# Patient Record
Sex: Female | Born: 1990
Health system: Southern US, Community
[De-identification: ages and names within clinical notes are randomized; demographics above are authoritative.]

## PROBLEM LIST (undated history)

## (undated) ENCOUNTER — Inpatient Hospital Stay: Payer: Self-pay

## (undated) ENCOUNTER — Emergency Department: Admission: EM | Discharge status: Absent without leave | Payer: PRIVATE HEALTH INSURANCE

## (undated) DIAGNOSIS — F32A Depression, unspecified: Secondary | ICD-10-CM

## (undated) DIAGNOSIS — S129XXA Fracture of neck, unspecified, initial encounter: Secondary | ICD-10-CM

## (undated) DIAGNOSIS — F329 Major depressive disorder, single episode, unspecified: Secondary | ICD-10-CM

## (undated) DIAGNOSIS — Z789 Other specified health status: Secondary | ICD-10-CM

## (undated) HISTORY — DX: Fracture of neck, unspecified, initial encounter: S12.9XXA

## (undated) HISTORY — PX: WISDOM TOOTH EXTRACTION: SHX21

## (undated) HISTORY — PX: TONSILLECTOMY: SUR1361

## (undated) HISTORY — PX: RHINOPLASTY: SUR1284

## (undated) HISTORY — PX: ANKLE FRACTURE SURGERY: SHX122

---
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
## 2015-02-17 ENCOUNTER — Ambulatory Visit (INDEPENDENT_AMBULATORY_CARE_PROVIDER_SITE_OTHER): Payer: BLUE CROSS/BLUE SHIELD | Admitting: Obstetrics and Gynecology

## 2015-02-17 ENCOUNTER — Encounter: Payer: Self-pay | Admitting: Obstetrics and Gynecology

## 2015-02-17 VITALS — BP 120/84 | HR 94 | Ht 65.0 in | Wt 156.5 lb

## 2015-02-17 DIAGNOSIS — Z3491 Encounter for supervision of normal pregnancy, unspecified, first trimester: Secondary | ICD-10-CM

## 2015-02-17 DIAGNOSIS — N926 Irregular menstruation, unspecified: Secondary | ICD-10-CM

## 2015-02-17 LAB — POCT URINE PREGNANCY: Preg Test, Ur: POSITIVE — AB

## 2015-02-17 NOTE — Patient Instructions (Signed)
How a Baby Grows During Pregnancy Pregnancy begins when a female's sperm enters a female's egg (fertilization). This happens in one of the tubes (fallopian tubes) that connect the ovaries to the womb (uterus). The fertilized egg is called an embryo until it reaches 10 weeks. From 10 weeks until birth, it is called a fetus. The fertilized egg moves down the fallopian tube to the uterus. Then it implants into the lining of the uterus and begins to grow. The developing fetus receives oxygen and nutrients through the pregnant woman's bloodstream and the tissues that grow (placenta) to support the fetus. The placenta is the life support system for the fetus. It provides nutrition and removes waste. Learning as much as you can about your pregnancy and how your baby is developing can help you enjoy the experience. It can also make you aware of when there might be a problem and when to ask questions. HOW LONG DOES A TYPICAL PREGNANCY LAST? A pregnancy usually lasts 280 days, or about 40 weeks. Pregnancy is divided into three trimesters:  First trimester: 0-13 weeks.  Second trimester: 14-27 weeks.  Third trimester: 28-40 weeks. The day when your baby is considered ready to be born (full term) is your estimated date of delivery. HOW DOES MY BABY DEVELOP MONTH BY MONTH? First month  The fertilized egg attaches to the inside of the uterus.  Some cells will form the placenta. Others will form the fetus.  The arms, legs, brain, spinal cord, lungs, and heart begin to develop.  At the end of the first month, the heart begins to beat. Second month  The bones, inner ear, eyelids, hands, and feet form.  The genitals develop.  By the end of 8 weeks, all major organs are developing. Third month  All of the internal organs are forming.  Teeth develop below the gums.  Bones and muscles begin to grow. The spine can flex.  The skin is transparent.  Fingernails and toenails begin to form.  Arms and  legs continue to grow longer, and hands and feet develop.  The fetus is about 3 in (7.6 cm) long. Fourth month  The placenta is completely formed.  The external sex organs, neck, outer ear, eyebrows, eyelids, and fingernails are formed.  The fetus can hear, swallow, and move its arms and legs.  The kidneys begin to produce urine.  The skin is covered with a white waxy coating (vernix) and very fine hair (lanugo). Fifth month  The fetus moves around more and can be felt for the first time (quickening).  The fetus starts to sleep and wake up and may begin to suck its finger.  The nails grow to the end of the fingers.  The organ in the digestive system that makes bile (gallbladder) functions and helps to digest the nutrients.  If your baby is a girl, eggs are present in her ovaries. If your baby is a boy, testicles start to move down into his scrotum. Sixth month  The lungs are formed, but the fetus is not yet able to breathe.  The eyes open. The brain continues to develop.  Your baby has fingerprints and toe prints. Your baby's hair grows thicker.  At the end of the second trimester, the fetus is about 9 in (22.9 cm) long. Seventh month  The fetus kicks and stretches.  The eyes are developed enough to sense changes in light.  The hands can make a grasping motion.  The fetus responds to sound. Eighth month  All organs and body systems are fully developed and functioning.  Bones harden and taste buds develop. The fetus may hiccup.  Certain areas of the brain are still developing. The skull remains soft. Ninth month  The fetus gains about  lb (0.23 kg) each week.  The lungs are fully developed.  Patterns of sleep develop.  The fetus's head typically moves into a head-down position (vertex) in the uterus to prepare for birth. If the buttocks move into a vertex position instead, the baby is breech.  The fetus weighs 6-9 lbs (2.72-4.08 kg) and is 19-20 in  (48.26-50.8 cm) long. WHAT CAN I DO TO HAVE A HEALTHY PREGNANCY AND HELP MY BABY DEVELOP? Eating and Drinking  Eat a healthy diet.  Talk with your health care provider to make sure that you are getting the nutrients that you and your baby need.  Visit www.BuildDNA.es to learn about creating a healthy diet.  Gain a healthy amount of weight during pregnancy as advised by your health care provider. This is usually 25-35 pounds. You may need to:  Gain more if you were underweight before getting pregnant or if you are pregnant with more than one baby.  Gain less if you were overweight or obese when you got pregnant. Medicines and Vitamins  Take prenatal vitamins as directed by your health care provider. These include vitamins such as folic acid, iron, calcium, and vitamin D. They are important for healthy development.  Take medicines only as directed by your health care provider. Read labels and ask a pharmacist or your health care provider whether over-the-counter medicines, supplements, and prescription drugs are safe to take during pregnancy. Activities  Be physically active as advised by your health care provider. Ask your health care provider to recommend activities that are safe for you to do, such as walking or swimming.  Do not participate in strenuous or extreme sports. Lifestyle  Do not drink alcohol.  Do not use any tobacco products, including cigarettes, chewing tobacco, or electronic cigarettes. If you need help quitting, ask your health care provider.  Do not use illegal drugs. Safety  Avoid exposure to mercury, lead, or other heavy metals. Ask your health care provider about common sources of these heavy metals.  Avoid listeria infection during pregnancy. Follow these precautions:  Do not eat soft cheeses or deli meats.  Do not eat hot dogs unless they have been warmed up to the point of steaming, such as in the microwave oven.  Do not drink unpasteurized  milk.  Avoid toxoplasmosis infection during pregnancy. Follow these precautions:  Do not change your cat's litter box, if you have a cat. Ask someone else to do this for you.  Wear gardening gloves while working in the yard. General Instructions  Keep all follow-up visits as directed by your health care provider. This is important. This includes prenatal care and screening tests.  Manage any chronic health conditions. Work closely with your health care provider to keep conditions, such as diabetes, under control. HOW DO I KNOW IF MY BABY IS DEVELOPING WELL? At each prenatal visit, your health care provider will do several different tests to check on your health and keep track of your baby's development. These include:  Fundal height.  Your health care provider will measure your growing belly from top to bottom using a tape measure.  Your health care provider will also feel your belly to determine your baby's position.  Heartbeat.  An ultrasound in the first trimester can confirm  pregnancy and show a heartbeat, depending on how far along you are.  Your health care provider will check your baby's heart rate at every prenatal visit.  As you get closer to your delivery date, you may have regular fetal heart rate monitoring to make sure that your baby is not in distress.  Second trimester ultrasound.  This ultrasound checks your baby's development. It also indicates your baby's gender. WHAT SHOULD I DO IF I HAVE CONCERNS ABOUT MY BABY'S DEVELOPMENT? Always talk with your health care provider about any concerns that you may have.   This information is not intended to replace advice given to you by your health care provider. Make sure you discuss any questions you have with your health care provider.   Document Released: 09/27/2007 Document Revised: 12/30/2014 Document Reviewed: 09/17/2013 Elsevier Interactive Patient Education 2016 [employer]. First Trimester of Pregnancy The  first trimester of pregnancy is from week 1 until the end of week 12 (months 1 through 3). A week after a sperm fertilizes an egg, the egg will implant on the wall of the uterus. This embryo will begin to develop into a baby. Genes from you and your partner are forming the baby. The female genes determine whether the baby is a boy or a girl. At 6-8 weeks, the eyes and face are formed, and the heartbeat can be seen on ultrasound. At the end of 12 weeks, all the baby's organs are formed.  Now that you are pregnant, you will want to do everything you can to have a healthy baby. Two of the most important things are to get good prenatal care and to follow your health care provider's instructions. Prenatal care is all the medical care you receive before the baby's birth. This care will help prevent, find, and treat any problems during the pregnancy and childbirth. BODY CHANGES Your body goes through many changes during pregnancy. The changes vary from woman to woman.   You may gain or lose a couple of pounds at first.  You may feel sick to your stomach (nauseous) and throw up (vomit). If the vomiting is uncontrollable, call your health care provider.  You may tire easily.  You may develop headaches that can be relieved by medicines approved by your health care provider.  You may urinate more often. Painful urination may mean you have a bladder infection.  You may develop heartburn as a result of your pregnancy.  You may develop constipation because certain hormones are causing the muscles that push waste through your intestines to slow down.  You may develop hemorrhoids or swollen, bulging veins (varicose veins).  Your breasts may begin to grow larger and become tender. Your nipples may stick out more, and the tissue that surrounds them (areola) may become darker.  Your gums may bleed and may be sensitive to brushing and flossing.  Dark spots or blotches (chloasma, mask of pregnancy) may develop on  your face. This will likely fade after the baby is born.  Your menstrual periods will stop.  You may have a loss of appetite.  You may develop cravings for certain kinds of food.  You may have changes in your emotions from day to day, such as being excited to be pregnant or being concerned that something may go wrong with the pregnancy and baby.  You may have more vivid and strange dreams.  You may have changes in your hair. These can include thickening of your hair, rapid growth, and changes in texture. Some  women also have hair loss during or after pregnancy, or hair that feels dry or thin. Your hair will most likely return to normal after your baby is born. WHAT TO EXPECT AT YOUR PRENATAL VISITS During a routine prenatal visit:  You will be weighed to make sure you and the baby are growing normally.  Your blood pressure will be taken.  Your abdomen will be measured to track your baby's growth.  The fetal heartbeat will be listened to starting around week 10 or 12 of your pregnancy.  Test results from any previous visits will be discussed. Your health care provider may ask you:  How you are feeling.  If you are feeling the baby move.  If you have had any abnormal symptoms, such as leaking fluid, bleeding, severe headaches, or abdominal cramping.  If you are using any tobacco products, including cigarettes, chewing tobacco, and electronic cigarettes.  If you have any questions. Other tests that may be performed during your first trimester include:  Blood tests to find your blood type and to check for the presence of any previous infections. They will also be used to check for low iron levels (anemia) and Rh antibodies. Later in the pregnancy, blood tests for diabetes will be done along with other tests if problems develop.  Urine tests to check for infections, diabetes, or protein in the urine.  An ultrasound to confirm the proper growth and development of the baby.  An  amniocentesis to check for possible genetic problems.  Fetal screens for spina bifida and Down syndrome.  You may need other tests to make sure you and the baby are doing well.  HIV (human immunodeficiency virus) testing. Routine prenatal testing includes screening for HIV, unless you choose not to have this test. HOME CARE INSTRUCTIONS  Medicines  Follow your health care provider's instructions regarding medicine use. Specific medicines may be either safe or unsafe to take during pregnancy.  Take your prenatal vitamins as directed.  If you develop constipation, try taking a stool softener if your health care provider approves. Diet  Eat regular, well-balanced meals. Choose a variety of foods, such as meat or vegetable-based protein, fish, milk and low-fat dairy products, vegetables, fruits, and whole grain breads and cereals. Your health care provider will help you determine the amount of weight gain that is right for you.  Avoid raw meat and uncooked cheese. These carry germs that can cause birth defects in the baby.  Eating four or five small meals rather than three large meals a day may help relieve nausea and vomiting. If you start to feel nauseous, eating a few soda crackers can be helpful. Drinking liquids between meals instead of during meals also seems to help nausea and vomiting.  If you develop constipation, eat more high-fiber foods, such as fresh vegetables or fruit and whole grains. Drink enough fluids to keep your urine clear or pale yellow. Activity and Exercise  Exercise only as directed by your health care provider. Exercising will help you:  Control your weight.  Stay in shape.  Be prepared for labor and delivery.  Experiencing pain or cramping in the lower abdomen or low back is a good sign that you should stop exercising. Check with your health care provider before continuing normal exercises.  Try to avoid standing for long periods of time. Move your legs  often if you must stand in one place for a long time.  Avoid heavy lifting.  Wear low-heeled shoes, and practice good posture.  You may continue to have sex unless your health care provider directs you otherwise. Relief of Pain or Discomfort  Wear a good support bra for breast tenderness.   Take warm sitz baths to soothe any pain or discomfort caused by hemorrhoids. Use hemorrhoid cream if your health care provider approves.   Rest with your legs elevated if you have leg cramps or low back pain.  If you develop varicose veins in your legs, wear support hose. Elevate your feet for 15 minutes, 3-4 times a day. Limit salt in your diet. Prenatal Care  Schedule your prenatal visits by the twelfth week of pregnancy. They are usually scheduled monthly at first, then more often in the last 2 months before delivery.  Write down your questions. Take them to your prenatal visits.  Keep all your prenatal visits as directed by your health care provider. Safety  Wear your seat belt at all times when driving.  Make a list of emergency phone numbers, including numbers for family, friends, the hospital, and police and fire departments. General Tips  Ask your health care provider for a referral to a local prenatal education class. Begin classes no later than at the beginning of month 6 of your pregnancy.  Ask for help if you have counseling or nutritional needs during pregnancy. Your health care provider can offer advice or refer you to specialists for help with various needs.  Do not use hot tubs, steam rooms, or saunas.  Do not douche or use tampons or scented sanitary pads.  Do not cross your legs for long periods of time.  Avoid cat litter boxes and soil used by cats. These carry germs that can cause birth defects in the baby and possibly loss of the fetus by miscarriage or stillbirth.  Avoid all smoking, herbs, alcohol, and medicines not prescribed by your health care provider.  Chemicals in these affect the formation and growth of the baby.  Do not use any tobacco products, including cigarettes, chewing tobacco, and electronic cigarettes. If you need help quitting, ask your health care provider. You may receive counseling support and other resources to help you quit.  Schedule a dentist appointment. At home, brush your teeth with a soft toothbrush and be gentle when you floss. SEEK MEDICAL CARE IF:   You have dizziness.  You have mild pelvic cramps, pelvic pressure, or nagging pain in the abdominal area.  You have persistent nausea, vomiting, or diarrhea.  You have a bad smelling vaginal discharge.  You have pain with urination.  You notice increased swelling in your face, hands, legs, or ankles. SEEK IMMEDIATE MEDICAL CARE IF:   You have a fever.  You are leaking fluid from your vagina.  You have spotting or bleeding from your vagina.  You have severe abdominal cramping or pain.  You have rapid weight gain or loss.  You vomit blood or material that looks like coffee grounds.  You are exposed to [place] measles and have never had them.  You are exposed to fifth disease or chickenpox.  You develop a severe headache.  You have shortness of breath.  You have any kind of trauma, such as from a fall or a car accident.   This information is not intended to replace advice given to you by your health care provider. Make sure you discuss any questions you have with your health care provider.   Document Released: 04/04/2001 Document Revised: 05/01/2014 Document Reviewed: 02/18/2013 Elsevier Interactive Patient Education [employer] Elsevier Inc.

## 2015-02-17 NOTE — Progress Notes (Signed)
Subjective:     Patient ID: Suzanne HurlNicole Duerst, female   DOB: Feb 16, 1991, 24 y.o.   MRN: [id number]  HPI LMP 01/11/15 with EGA 251w3d and EDC 10/19/15  Review of Systems Slight nausea Light brown spotting x 2 days last week    Objective:   Physical Exam A&O x4 Well groomed female in no distress Pelvic exam not indicated UPT +    Assessment:     First trimester pregnancy      Plan:     Viability scan in 2 weeks with NOB Labs and nurse intake NOB PE in 5 weeks Yolanda BonineMelody Burr, CNM

## 2015-03-03 ENCOUNTER — Ambulatory Visit: Payer: BLUE CROSS/BLUE SHIELD

## 2015-03-03 ENCOUNTER — Ambulatory Visit (INDEPENDENT_AMBULATORY_CARE_PROVIDER_SITE_OTHER): Payer: BLUE CROSS/BLUE SHIELD | Admitting: Obstetrics and Gynecology

## 2015-03-03 VITALS — BP 118/68 | HR 78 | Wt 158.0 lb

## 2015-03-03 DIAGNOSIS — R638 Other symptoms and signs concerning food and fluid intake: Secondary | ICD-10-CM

## 2015-03-03 DIAGNOSIS — Z331 Pregnant state, incidental: Secondary | ICD-10-CM

## 2015-03-03 DIAGNOSIS — Z3490 Encounter for supervision of normal pregnancy, unspecified, unspecified trimester: Secondary | ICD-10-CM

## 2015-03-03 DIAGNOSIS — N926 Irregular menstruation, unspecified: Secondary | ICD-10-CM | POA: Diagnosis not present

## 2015-03-03 DIAGNOSIS — Z113 Encounter for screening for infections with a predominantly sexual mode of transmission: Secondary | ICD-10-CM

## 2015-03-03 MED ORDER — CONCEPT DHA 53.5-38-1 MG PO CAPS: 1.0000 | ORAL_CAPSULE | Freq: Every day | ORAL | Status: DC

## 2015-03-03 NOTE — Progress Notes (Signed)
Suzanne HurlNicole Brame presents for NOB nurse interview visit. G-2.  P-0. Pregnancy eduction material explained and given. No cats in the home. NOB labs ordered. TSH/HbgA1c due to Increased BMI, HIV labs and Drug screen were explained optional and she could opt out of tests but did not decline. Drug screen ordered and sent to lab. PNV encouraged. Declined genetic testing. Pt. To follow up with provider in 4 vweeks for NOB physical.  All questions answered.  ZIKA EXPOSURE SCREEN:  The patient has not traveled to a [place] endemic area within the past 6 months, nor has she had unprotected sex with a partner who has travelled to a [place] endemic region within the past 6 months. The patient has been advised to notify us if these factors change any time during this current pregnancy, so adequate testing and monitoring can be initiated.

## 2015-03-03 NOTE — Patient Instructions (Signed)

## 2015-03-04 LAB — MICROSCOPIC EXAMINATION: Casts: NONE SEEN /lpf

## 2015-03-04 LAB — PAIN MGT SCRN (14 DRUGS), UR
Amphetamine Screen, Ur: NEGATIVE ng/mL
BENZODIAZEPINE SCREEN, URINE: NEGATIVE ng/mL
Barbiturate Screen, Ur: NEGATIVE ng/mL
Buprenorphine, Urine: NEGATIVE ng/mL
CANNABINOIDS UR QL SCN: NEGATIVE ng/mL
CREATININE(CRT), U: 34.7 mg/dL (ref 20.0–300.0)
Cocaine(Metab.)Screen, Urine: NEGATIVE ng/mL
FENTANYL, URINE: NEGATIVE pg/mL
MEPERIDINE SCREEN, URINE: NEGATIVE ng/mL
Methadone Scn, Ur: NEGATIVE ng/mL
OPIATE SCRN UR: NEGATIVE ng/mL
OXYCODONE+OXYMORPHONE UR QL SCN: NEGATIVE ng/mL
PCP SCRN UR: NEGATIVE ng/mL
PROPOXYPHENE SCREEN: NEGATIVE ng/mL
Ph of Urine: 6.5 (ref 4.5–8.9)
Tramadol Ur Ql Scn: NEGATIVE ng/mL

## 2015-03-04 LAB — GC/CHLAMYDIA PROBE AMP
CHLAMYDIA, DNA PROBE: NEGATIVE
NEISSERIA GONORRHOEAE BY PCR: NEGATIVE

## 2015-03-04 LAB — CBC WITH DIFFERENTIAL/PLATELET
BASOS ABS: 0 10*3/uL (ref 0.0–0.2)
Basos: 0 %
EOS (ABSOLUTE): 0.1 10*3/uL (ref 0.0–0.4)
Eos: 1 %
HEMATOCRIT: 38.6 % (ref 34.0–46.6)
Hemoglobin: 13.2 g/dL (ref 11.1–15.9)
Immature Grans (Abs): 0 10*3/uL (ref 0.0–0.1)
Immature Granulocytes: 0 %
LYMPHS ABS: 2.4 10*3/uL (ref 0.7–3.1)
Lymphs: 24 %
MCH: 30.8 pg (ref 26.6–33.0)
MCHC: 34.2 g/dL (ref 31.5–35.7)
MCV: 90 fL (ref 79–97)
MONOS ABS: 0.8 10*3/uL (ref 0.1–0.9)
Monocytes: 8 %
NEUTROS ABS: 6.7 10*3/uL (ref 1.4–7.0)
Neutrophils: 67 %
Platelets: 365 10*3/uL (ref 150–379)
RBC: 4.28 x10E6/uL (ref 3.77–5.28)
RDW: 13.2 % (ref 12.3–15.4)
WBC: 10 10*3/uL (ref 3.4–10.8)

## 2015-03-04 LAB — URINALYSIS, ROUTINE W REFLEX MICROSCOPIC
BILIRUBIN UA: NEGATIVE
Glucose, UA: NEGATIVE
Ketones, UA: NEGATIVE
NITRITE UA: NEGATIVE
PH UA: 7 (ref 5.0–7.5)
PROTEIN UA: NEGATIVE
RBC UA: NEGATIVE
Specific Gravity, UA: 1.008 (ref 1.005–1.030)
UUROB: 0.2 mg/dL (ref 0.2–1.0)

## 2015-03-04 LAB — TSH: TSH: 2.39 u[IU]/mL (ref 0.450–4.500)

## 2015-03-04 LAB — HIV ANTIBODY (ROUTINE TESTING W REFLEX): HIV Screen 4th Generation wRfx: NONREACTIVE

## 2015-03-04 LAB — NICOTINE SCREEN, URINE: Cotinine Ql Scrn, Ur: NEGATIVE ng/mL

## 2015-03-04 LAB — RH TYPE: Rh Factor: POSITIVE

## 2015-03-04 LAB — RUBELLA ANTIBODY, IGM: Rubella IgM: <20 AU/mL (ref 0.0–19.9)

## 2015-03-04 LAB — ABO

## 2015-03-04 LAB — RPR: RPR: NONREACTIVE

## 2015-03-04 LAB — ANTIBODY SCREEN: ANTIBODY SCREEN: NEGATIVE

## 2015-03-04 LAB — HEPATITIS B SURFACE ANTIGEN: Hepatitis B Surface Ag: NEGATIVE

## 2015-03-04 LAB — VARICELLA ZOSTER ANTIBODY, IGM

## 2015-03-05 LAB — URINE CULTURE, OB REFLEX

## 2015-03-05 LAB — HEMOGLOBIN A1C: HEMOGLOBIN A1C: 5.2 % (ref 4.8–5.6)

## 2015-03-05 LAB — CULTURE, OB URINE

## 2015-03-09 ENCOUNTER — Encounter: Payer: Self-pay | Admitting: Obstetrics and Gynecology

## 2015-03-25 ENCOUNTER — Ambulatory Visit (INDEPENDENT_AMBULATORY_CARE_PROVIDER_SITE_OTHER): Payer: BLUE CROSS/BLUE SHIELD | Admitting: Obstetrics and Gynecology

## 2015-03-25 ENCOUNTER — Encounter: Payer: Self-pay | Admitting: Obstetrics and Gynecology

## 2015-03-25 ENCOUNTER — Other Ambulatory Visit: Payer: Self-pay | Admitting: Obstetrics and Gynecology

## 2015-03-25 VITALS — BP 110/64 | HR 74 | Wt 157.1 lb

## 2015-03-25 DIAGNOSIS — Z283 Underimmunization status: Secondary | ICD-10-CM

## 2015-03-25 DIAGNOSIS — O09899 Supervision of other high risk pregnancies, unspecified trimester: Secondary | ICD-10-CM

## 2015-03-25 DIAGNOSIS — Z2839 Other underimmunization status: Secondary | ICD-10-CM | POA: Insufficient documentation

## 2015-03-25 DIAGNOSIS — O9989 Other specified diseases and conditions complicating pregnancy, childbirth and the puerperium: Secondary | ICD-10-CM

## 2015-03-25 DIAGNOSIS — Z789 Other specified health status: Secondary | ICD-10-CM

## 2015-03-25 DIAGNOSIS — Z3491 Encounter for supervision of normal pregnancy, unspecified, first trimester: Secondary | ICD-10-CM

## 2015-03-25 LAB — POCT URINALYSIS DIPSTICK
BILIRUBIN UA: NEGATIVE
Blood, UA: NEGATIVE
GLUCOSE UA: NEGATIVE
Ketones, UA: NEGATIVE
LEUKOCYTES UA: NEGATIVE
NITRITE UA: NEGATIVE
Protein, UA: NEGATIVE
Spec Grav, UA: 1.01
Urobilinogen, UA: 0.2
pH, UA: 6.5

## 2015-03-25 NOTE — Progress Notes (Signed)
NOB-c/o R groin pulling sensation, denies any other complaints

## 2015-03-25 NOTE — Patient Instructions (Signed)

## 2015-03-25 NOTE — Progress Notes (Signed)
NEW OB HISTORY AND PHYSICAL  SUBJECTIVE:       Suzanne Ross is a 24 y.o. G1P0 female, Patient's last menstrual period was 01/11/2015 (exact date)., Estimated Date of Delivery: 10/18/15, 3859w3d, presents today for establishment of Prenatal Care. She has no unusual complaints and complains of fatigue      Gynecologic History Patient's last menstrual period was 01/11/2015 (exact date). Normal Contraception: none Last Pap: NA  Obstetric History OB History  Gravida Para Term Preterm AB SAB TAB Ectopic Multiple Living  1             # Outcome Date GA Lbr Len/2nd Weight Sex Delivery Anes PTL Lv  1 Current               Past Medical History  Diagnosis Date  . Broken neck [facility])     Past Surgical History  Procedure Laterality Date  . Rhinoplasty    . Appendectomy    . Tonsillectomy      Current Outpatient Prescriptions on File Prior to Visit  Medication Sig Dispense Refill  . Prenat-FeFum-FePo-FA-Omega 3 (CONCEPT DHA) 53.5-38-1 MG CAPS Take 1 capsule by mouth daily. 30 capsule 10   No current facility-administered medications on file prior to visit.    No Known Allergies  Social History   Social History  . Marital Status: Married    Spouse Name: N/A  . Number of Children: N/A  . Years of Education: N/A   Occupational History  . Not on file.   Social History Main Topics  . Smoking status: Never Smoker   . Smokeless tobacco: Never Used  . Alcohol Use: No  . Drug Use: No  . Sexual Activity:    Partners: Male    Birth Control/ Protection: None   Other Topics Concern  . Not on file   Social History Narrative    Family History  Problem Relation Age of Onset  . Hypertension Maternal Grandmother   . Diabetes      fob's-father    The following portions of the patient's history were reviewed and updated as appropriate: allergies, current medications, past OB history, past medical history, past surgical history, past family history, past social history, and  problem list.    OBJECTIVE: Initial Physical Exam (New OB)  GENERAL APPEARANCE: alert, well appearing, in no apparent distress, oriented to person, place and time HEAD: normocephalic, atraumatic MOUTH: mucous membranes moist, pharynx normal without lesions THYROID: no thyromegaly or masses present BREASTS: no masses noted, no significant tenderness, no palpable axillary nodes, no skin changes LUNGS: clear to auscultation, no wheezes, rales or rhonchi, symmetric air entry HEART: regular rate and rhythm, no murmurs ABDOMEN: soft, nontender, nondistended, no abnormal masses, no epigastric pain EXTREMITIES: no redness or tenderness in the calves or thighs SKIN: normal coloration and turgor, no rashes LYMPH NODES: no adenopathy palpable NEUROLOGIC: alert, oriented, normal speech, no focal findings or movement disorder noted  PELVIC EXAM EXTERNAL GENITALIA: normal appearing vulva with no masses, tenderness or lesions CERVIX: no lesions or cervical motion tenderness UTERUS: gravid and consistent with 11 weeks ADNEXA: no masses palpable and nontender  ASSESSMENT: Normal pregnancy  PLAN: Prenatal care See orders

## 2015-03-29 LAB — CYTOLOGY - PAP

## 2015-04-22 ENCOUNTER — Ambulatory Visit (INDEPENDENT_AMBULATORY_CARE_PROVIDER_SITE_OTHER): Payer: BLUE CROSS/BLUE SHIELD | Admitting: Obstetrics and Gynecology

## 2015-04-22 ENCOUNTER — Encounter: Payer: Self-pay | Admitting: Obstetrics and Gynecology

## 2015-04-22 VITALS — BP 112/68 | HR 78 | Wt 155.6 lb

## 2015-04-22 DIAGNOSIS — Z3492 Encounter for supervision of normal pregnancy, unspecified, second trimester: Secondary | ICD-10-CM | POA: Diagnosis not present

## 2015-04-22 LAB — POCT URINALYSIS DIPSTICK
Bilirubin, UA: NEGATIVE
Blood, UA: NEGATIVE
Glucose, UA: NEGATIVE
Ketones, UA: NEGATIVE
LEUKOCYTES UA: NEGATIVE
NITRITE UA: NEGATIVE
PH UA: 7
Spec Grav, UA: 1.015
Urobilinogen, UA: 0.2

## 2015-04-22 MED ORDER — INFLUENZA VAC SPLIT QUAD 0.5 ML IM SUSY
0.5000 mL | PREFILLED_SYRINGE | Freq: Once | INTRAMUSCULAR | Status: AC
  Administered 2015-04-22: 0.5 mL via INTRAMUSCULAR

## 2015-04-22 NOTE — Progress Notes (Signed)
ROB- pt is worried b/c her appetite has decreased she just isnt hungry

## 2015-04-22 NOTE — Patient Instructions (Signed)

## 2015-04-22 NOTE — Addendum Note (Signed)
Addended by: Rosine BeatLONTZ, AMY L on: 04/22/2015 11:44 AM   Modules accepted: Orders

## 2015-04-25 NOTE — L&D Delivery Note (Signed)
Delivery Note At  a viable and healthy female was delivered via  (PresentationROA: ;  ).  APGAR: 8,9 ; weight  .   Placenta status: delivered intact with 3 vessel  Cord:  with the following complications: NCx1 reduced on perineum  Anesthesia:  none Episiotomy:  none Lacerations:  1st degree and left labial Suture Repair: 3.0 vicryl rapide Est. Blood Loss (mL):  300  Mom to postpartum.  Baby to Couplet care / Skin to Skin.  [name] N Shambley,CNM 10/06/2015, 2:38 AM

## 2015-05-21 ENCOUNTER — Telehealth: Payer: Self-pay | Admitting: Obstetrics and Gynecology

## 2015-05-21 NOTE — Telephone Encounter (Signed)
20 wks and wake up in the morning with intense pain on her left side. She is concerned about it. It goes away.

## 2015-05-21 NOTE — Telephone Encounter (Signed)
Called pt advised her to really hydrate and she will call us 1st of the week if she needs [place]

## 2015-05-25 ENCOUNTER — Ambulatory Visit (INDEPENDENT_AMBULATORY_CARE_PROVIDER_SITE_OTHER): Payer: BLUE CROSS/BLUE SHIELD | Admitting: Obstetrics and Gynecology

## 2015-05-25 ENCOUNTER — Ambulatory Visit (INDEPENDENT_AMBULATORY_CARE_PROVIDER_SITE_OTHER): Payer: BLUE CROSS/BLUE SHIELD

## 2015-05-25 ENCOUNTER — Encounter: Payer: Self-pay | Admitting: Obstetrics and Gynecology

## 2015-05-25 VITALS — BP 124/64 | HR 91 | Wt 158.5 lb

## 2015-05-25 DIAGNOSIS — Z331 Pregnant state, incidental: Secondary | ICD-10-CM

## 2015-05-25 DIAGNOSIS — Z3492 Encounter for supervision of normal pregnancy, unspecified, second trimester: Secondary | ICD-10-CM

## 2015-05-25 DIAGNOSIS — IMO0002 Reserved for concepts with insufficient information to code with codable children: Secondary | ICD-10-CM

## 2015-05-25 LAB — POCT URINALYSIS DIPSTICK
BILIRUBIN UA: NEGATIVE
Glucose, UA: NEGATIVE
Ketones, UA: NEGATIVE
LEUKOCYTES UA: NEGATIVE
NITRITE UA: NEGATIVE
PH UA: 6.5
PROTEIN UA: NEGATIVE
RBC UA: NEGATIVE
Spec Grav, UA: <=1.005
UROBILINOGEN UA: 0.2

## 2015-05-25 NOTE — Progress Notes (Signed)
ROB-anatomy scan done today, L side pain several days last week

## 2015-05-25 NOTE — Progress Notes (Signed)
Indications:Anatomy U/S Findings:  Singleton intrauterine pregnancy is visualized with FHR at 145 BPM. There is an arhythmia noted on [place] waveform. Biometrics give an (U/S) Gestational age of 19 2/7 weeks and an (U/S) EDD of 10/17/15; this correlates with the clinically established EDD of 10/18/15.  Fetal presentation is Vertex.  EFW: 281g (10oz). Placenta: Anterior, Grade 1, 6.6cm from internal os. AFI: Adequate with MVP of 3.5cm.  Anatomic survey is incomplete and normal; Gender - female  .  Anatomy needed: Ductal and Aortic Arch.  Right Ovary measures 2.8 x 1.3 x 2 cm. It is normal in appearance. Left Ovary measures 2.7 x 1.9 x 2 cm. It is normal appearance. There is evidence of a corpus luteal cyst in the Left ovary. Survey of the adnexa demonstrates no adnexal masses. There is no free peritoneal fluid in the cul de sac.  Impression: 1. 19 2/7 week Viable Singleton Intrauterine pregnancy by U/S. 2. (U/S) EDD is consistent with Clinically established (LMP) EDD of 10/18/15. 3. Arhythmia noted on [place] waveform 4. Incomplete anatomy scan  Fetal arrythmia not noted on doppler after u/s- reassured patient- will still f/u in 2 weeks to complete anatomy scan

## 2015-05-26 ENCOUNTER — Encounter: Payer: BLUE CROSS/BLUE SHIELD | Admitting: Obstetrics and Gynecology

## 2015-06-04 ENCOUNTER — Other Ambulatory Visit: Payer: Self-pay | Admitting: Obstetrics and Gynecology

## 2015-06-04 DIAGNOSIS — Z0489 Encounter for examination and observation for other specified reasons: Secondary | ICD-10-CM

## 2015-06-04 DIAGNOSIS — IMO0002 Reserved for concepts with insufficient information to code with codable children: Secondary | ICD-10-CM

## 2015-06-08 ENCOUNTER — Ambulatory Visit (INDEPENDENT_AMBULATORY_CARE_PROVIDER_SITE_OTHER): Payer: BLUE CROSS/BLUE SHIELD

## 2015-06-08 DIAGNOSIS — Z36 Encounter for antenatal screening of mother: Secondary | ICD-10-CM | POA: Diagnosis not present

## 2015-06-08 DIAGNOSIS — Z0489 Encounter for examination and observation for other specified reasons: Secondary | ICD-10-CM

## 2015-06-08 DIAGNOSIS — IMO0002 Reserved for concepts with insufficient information to code with codable children: Secondary | ICD-10-CM

## 2015-06-23 ENCOUNTER — Encounter: Payer: Self-pay | Admitting: Obstetrics and Gynecology

## 2015-06-23 ENCOUNTER — Ambulatory Visit (INDEPENDENT_AMBULATORY_CARE_PROVIDER_SITE_OTHER): Payer: BLUE CROSS/BLUE SHIELD | Admitting: Obstetrics and Gynecology

## 2015-06-23 VITALS — BP 114/64 | HR 78 | Wt 165.3 lb

## 2015-06-23 DIAGNOSIS — Z331 Pregnant state, incidental: Secondary | ICD-10-CM

## 2015-06-23 LAB — POCT URINALYSIS DIPSTICK
Bilirubin, UA: NEGATIVE
Glucose, UA: NEGATIVE
Ketones, UA: NEGATIVE
Leukocytes, UA: NEGATIVE
NITRITE UA: NEGATIVE
PH UA: 6.5
PROTEIN UA: NEGATIVE
RBC UA: NEGATIVE
SPEC GRAV UA: 1.01
UROBILINOGEN UA: 0.2

## 2015-06-23 NOTE — Progress Notes (Signed)
ROB- to increase water intake; discussed CBC, ICC, and BFC.

## 2015-06-23 NOTE — Progress Notes (Signed)
ROB- pt isn't sure if she is having braxton hicks??

## 2015-06-23 NOTE — Patient Instructions (Signed)
Braxton Hicks Contractions °Contractions of the uterus can occur throughout pregnancy. Contractions are not always a sign that you are in labor.  °WHAT ARE BRAXTON HICKS CONTRACTIONS?  °Contractions that occur before labor are called Braxton Hicks contractions, or false labor. Toward the end of pregnancy (32-34 weeks), these contractions can develop more often and may become more forceful. This is not true labor because these contractions do not result in opening (dilatation) and thinning of the cervix. They are sometimes difficult to tell apart from true labor because these contractions can be forceful and people have different pain tolerances. You should not feel embarrassed if you go to the hospital with false labor. Sometimes, the only way to tell if you are in true labor is for your health care provider to look for changes in the cervix. °If there are no prenatal problems or other health problems associated with the pregnancy, it is completely safe to be sent home with false labor and await the onset of true labor. °HOW CAN YOU TELL THE DIFFERENCE BETWEEN TRUE AND FALSE LABOR? °False Labor °· The contractions of false labor are usually shorter and not as hard as those of true labor.   °· The contractions are usually irregular.   °· The contractions are often felt in the front of the lower abdomen and in the groin.   °· The contractions may go away when you walk around or change positions while lying down.   °· The contractions get weaker and are shorter lasting as time goes on.   °· The contractions do not usually become progressively stronger, regular, and closer together as with true labor.   °True Labor °· Contractions in true labor last 30-70 seconds, become very regular, usually become more intense, and increase in frequency.   °· The contractions do not go away with walking.   °· The discomfort is usually felt in the top of the uterus and spreads to the lower abdomen and low back.   °· True labor can be  determined by your health care provider with an exam. This will show that the cervix is dilating and getting thinner.   °WHAT TO REMEMBER °· Keep up with your usual exercises and follow other instructions given by your health care provider.   °· Take medicines as directed by your health care provider.   °· Keep your regular prenatal appointments.   °· Eat and drink lightly if you think you are going into labor.   °· If Braxton Hicks contractions are making you uncomfortable:   °¨ Change your position from lying down or resting to walking, or from walking to resting.   °¨ Sit and rest in a tub of warm water.   °¨ Drink 2-3 glasses of water. Dehydration may cause these contractions.   °¨ Do slow and deep breathing several times an hour.   °WHEN SHOULD I SEEK IMMEDIATE MEDICAL CARE? °Seek immediate medical care if: °· Your contractions become stronger, more regular, and closer together.   °· You have fluid leaking or gushing from your vagina.   °· You have a fever.   °· You pass blood-tinged mucus.   °· You have vaginal bleeding.   °· You have continuous abdominal pain.   °· You have low back pain that you never had before.   °· You feel your baby's head pushing down and causing pelvic pressure.   °· Your baby is not moving as much as it used to.   °  °This information is not intended to replace advice given to you by your health care provider. Make sure you discuss any questions you have with your health care   provider. °  °Document Released: 04/10/2005 Document Revised: 04/15/2013 Document Reviewed: 01/20/2013 °Elsevier Interactive Patient Education ©2016 Elsevier Inc. ° °

## 2015-07-21 ENCOUNTER — Other Ambulatory Visit: Payer: Self-pay | Admitting: *Deleted

## 2015-07-21 DIAGNOSIS — Z331 Pregnant state, incidental: Secondary | ICD-10-CM

## 2015-07-21 DIAGNOSIS — Z131 Encounter for screening for diabetes mellitus: Secondary | ICD-10-CM

## 2015-07-22 ENCOUNTER — Other Ambulatory Visit: Payer: BLUE CROSS/BLUE SHIELD

## 2015-07-22 ENCOUNTER — Ambulatory Visit (INDEPENDENT_AMBULATORY_CARE_PROVIDER_SITE_OTHER): Payer: BLUE CROSS/BLUE SHIELD | Admitting: Obstetrics and Gynecology

## 2015-07-22 ENCOUNTER — Encounter: Payer: Self-pay | Admitting: Obstetrics and Gynecology

## 2015-07-22 VITALS — BP 112/66 | HR 90 | Wt 170.1 lb

## 2015-07-22 DIAGNOSIS — Z331 Pregnant state, incidental: Secondary | ICD-10-CM | POA: Diagnosis not present

## 2015-07-22 DIAGNOSIS — Z131 Encounter for screening for diabetes mellitus: Secondary | ICD-10-CM

## 2015-07-22 DIAGNOSIS — Z23 Encounter for immunization: Secondary | ICD-10-CM | POA: Diagnosis not present

## 2015-07-22 LAB — POCT URINALYSIS DIPSTICK
Bilirubin, UA: NEGATIVE
Blood, UA: NEGATIVE
Glucose, UA: NEGATIVE
KETONES UA: NEGATIVE
LEUKOCYTES UA: NEGATIVE
NITRITE UA: NEGATIVE
PH UA: 7
PROTEIN UA: NEGATIVE
Spec Grav, UA: <=1.005
Urobilinogen, UA: 0.2

## 2015-07-22 MED ORDER — TETANUS-DIPHTH-ACELL PERTUSSIS 5-2.5-18.5 LF-MCG/0.5 IM SUSP
0.5000 mL | Freq: Once | INTRAMUSCULAR | Status: AC
  Administered 2015-07-22: 0.5 mL via INTRAMUSCULAR

## 2015-07-22 NOTE — Progress Notes (Signed)
ROB-glucola done,blood consent signed, tdap given 

## 2015-07-22 NOTE — Progress Notes (Signed)
ROB- didn't get to check pt while here as I was out for delivery, called and discussed visit with patient; she denies any concerns; Has already enrolled in CBC, ICC, and BFC and Daddy Class.

## 2015-07-23 LAB — HEMOGLOBIN AND HEMATOCRIT, BLOOD
HEMATOCRIT: 31.6 % — AB (ref 34.0–46.6)
HEMOGLOBIN: 10.5 g/dL — AB (ref 11.1–15.9)

## 2015-07-23 LAB — GLUCOSE, 1 HOUR GESTATIONAL: Gestational Diabetes Screen: 134 mg/dL (ref 65–139)

## 2015-07-27 ENCOUNTER — Other Ambulatory Visit: Payer: Self-pay | Admitting: Obstetrics and Gynecology

## 2015-07-27 DIAGNOSIS — O99019 Anemia complicating pregnancy, unspecified trimester: Secondary | ICD-10-CM | POA: Insufficient documentation

## 2015-07-27 MED ORDER — FUSION PLUS PO CAPS: 1.0000 | ORAL_CAPSULE | Freq: Every day | ORAL | Status: DC

## 2015-08-05 ENCOUNTER — Ambulatory Visit (INDEPENDENT_AMBULATORY_CARE_PROVIDER_SITE_OTHER): Payer: BLUE CROSS/BLUE SHIELD | Admitting: Obstetrics and Gynecology

## 2015-08-05 ENCOUNTER — Encounter: Payer: Self-pay | Admitting: Obstetrics and Gynecology

## 2015-08-05 VITALS — BP 109/67 | HR 92 | Wt 174.5 lb

## 2015-08-05 DIAGNOSIS — Z331 Pregnant state, incidental: Secondary | ICD-10-CM

## 2015-08-05 LAB — POCT URINALYSIS DIPSTICK
Bilirubin, UA: NEGATIVE
GLUCOSE UA: NEGATIVE
Ketones, UA: NEGATIVE
Leukocytes, UA: NEGATIVE
Nitrite, UA: NEGATIVE
Protein, UA: NEGATIVE
RBC UA: NEGATIVE
Urobilinogen, UA: 0.2
pH, UA: 6

## 2015-08-05 NOTE — Progress Notes (Signed)
ROB- pt states @ night her legs hurt, tingling and numb sensation

## 2015-08-05 NOTE — Progress Notes (Signed)
ROB-doing well, magnesium for leg cramps, discussed circumcision and breast pump.

## 2015-08-10 ENCOUNTER — Other Ambulatory Visit: Payer: BLUE CROSS/BLUE SHIELD

## 2015-08-17 ENCOUNTER — Ambulatory Visit (INDEPENDENT_AMBULATORY_CARE_PROVIDER_SITE_OTHER): Payer: BLUE CROSS/BLUE SHIELD | Admitting: Obstetrics and Gynecology

## 2015-08-17 ENCOUNTER — Encounter: Payer: Self-pay | Admitting: Obstetrics and Gynecology

## 2015-08-17 VITALS — BP 110/67 | HR 91 | Wt 175.7 lb

## 2015-08-17 DIAGNOSIS — Z331 Pregnant state, incidental: Secondary | ICD-10-CM

## 2015-08-17 LAB — POCT URINALYSIS DIPSTICK
Bilirubin, UA: NEGATIVE
Blood, UA: NEGATIVE
Glucose, UA: NEGATIVE
Ketones, UA: NEGATIVE
LEUKOCYTES UA: NEGATIVE
NITRITE UA: NEGATIVE
PROTEIN UA: NEGATIVE
Spec Grav, UA: 1.015
UROBILINOGEN UA: 0.2
pH, UA: 6.5

## 2015-08-17 NOTE — Progress Notes (Signed)
ROB-recommended Tylenol PM

## 2015-08-17 NOTE — Progress Notes (Signed)
ROB- pt states she isnt sleeping well, states her muscles are sore(bottom of abdomen)

## 2015-08-30 ENCOUNTER — Telehealth: Payer: Self-pay | Admitting: *Deleted

## 2015-08-30 NOTE — Telephone Encounter (Signed)
Patient called and stated she got a call from our office and is returning the missed call. Patient is requesting a call back. Call back number is [phone].

## 2015-08-31 ENCOUNTER — Ambulatory Visit (INDEPENDENT_AMBULATORY_CARE_PROVIDER_SITE_OTHER): Payer: BLUE CROSS/BLUE SHIELD | Admitting: Obstetrics and Gynecology

## 2015-08-31 ENCOUNTER — Encounter: Payer: Self-pay | Admitting: Obstetrics and Gynecology

## 2015-08-31 VITALS — BP 110/64 | HR 89 | Wt 178.4 lb

## 2015-08-31 DIAGNOSIS — Z331 Pregnant state, incidental: Secondary | ICD-10-CM

## 2015-08-31 DIAGNOSIS — O26899 Other specified pregnancy related conditions, unspecified trimester: Secondary | ICD-10-CM

## 2015-08-31 DIAGNOSIS — R12 Heartburn: Secondary | ICD-10-CM

## 2015-08-31 DIAGNOSIS — R002 Palpitations: Secondary | ICD-10-CM

## 2015-08-31 LAB — POCT URINALYSIS DIPSTICK
BILIRUBIN UA: NEGATIVE
Glucose, UA: NEGATIVE
Ketones, UA: NEGATIVE
Nitrite, UA: NEGATIVE
PH UA: 6.5
PROTEIN UA: NEGATIVE
RBC UA: NEGATIVE
Spec Grav, UA: <=1.005
Urobilinogen, UA: 0.2

## 2015-08-31 MED ORDER — RANITIDINE HCL 150 MG PO TABS: 150.0000 mg | ORAL_TABLET | Freq: Two times a day (BID) | ORAL | Status: DC

## 2015-08-31 NOTE — Patient Instructions (Signed)
Heartburn During Pregnancy Heartburn is a burning sensation in the chest caused by stomach acid backing up into the esophagus. Heartburn is common in pregnancy because a certain hormone (progesterone) is released when a woman is pregnant. The progesterone hormone may relax the valve that separates the esophagus from the stomach. This allows acid to go up into the esophagus, causing heartburn. Heartburn may also happen in pregnancy because the enlarging uterus pushes up on the stomach, which pushes more acid into the esophagus. This is especially true in the later stages of pregnancy. Heartburn problems usually go away after giving birth. CAUSES  Heartburn is caused by stomach acid backing up into the esophagus. During pregnancy, this may result from various things, including:   The progesterone hormone.  Changing hormone levels.  The growing uterus pushing stomach acid upward.  Large meals.  Certain foods and drinks.  Exercise.  Increased acid production. SIGNS AND SYMPTOMS   Burning pain in the chest or lower throat.  Bitter taste in the mouth.  Coughing. DIAGNOSIS  Your health care provider will typically diagnose heartburn by taking a careful history of your concern. Blood tests may be done to check for a certain type of bacteria that is associated with heartburn. Sometimes, heartburn is diagnosed by prescribing a heartburn medicine to see if the symptoms improve. In some cases, a procedure called an endoscopy may be done. In this procedure, a tube with a light and a camera on the end (endoscope) is used to examine the esophagus and the stomach. TREATMENT  Treatment will vary depending on the severity of your symptoms. Your health care provider may recommend:  Over-the-counter medicines (antacids, acid reducers) for mild heartburn.  Prescription medicines to decrease stomach acid or to protect your stomach lining.  Certain changes in your diet.  Elevating the head of your bed  by putting blocks under the legs. This helps prevent stomach acid from backing up into the esophagus when you are lying down. HOME CARE INSTRUCTIONS   Only take over-the-counter or prescription medicines as directed by your health care provider.  Raise the head of your bed by putting blocks under the legs if instructed to do so by your health care provider. Sleeping with more pillows is not effective because it only changes the position of your head.  Do not exercise right after eating.  Avoid eating 2-3 hours before bed. Do not lie down right after eating.  Eat small meals throughout the day instead of three large meals.  Identify foods and beverages that make your symptoms worse and avoid them. Foods you may want to avoid include:  Peppers.  Chocolate.  High-fat foods, including fried foods.  Spicy foods.  Garlic and onions.  Citrus fruits, including oranges, grapefruit, lemons, and limes.  Food containing tomatoes or tomato products.  Mint.  Carbonated and caffeinated drinks.  Vinegar. SEEK MEDICAL CARE IF:  You have abdominal pain of any kind.  You feel burning in your upper abdomen or chest, especially after eating or lying down.  You have nausea and vomiting.  Your stomach feels upset after you eat. SEEK IMMEDIATE MEDICAL CARE IF:   You have severe chest pain that goes down your arm or into your jaw or neck.  You feel sweaty, dizzy, or light-headed.  You become short of breath.  You vomit blood.  You have difficulty or pain with swallowing.  You have bloody or black, tarry stools.  You have episodes of heartburn more than 3 times a week,   for more than 2 weeks. MAKE SURE YOU:  Understand these instructions.  Will watch your condition.  Will get help right away if you are not doing well or get worse.   This information is not intended to replace advice given to you by your health care provider. Make sure you discuss any questions you have with  your health care provider.   Document Released: 04/07/2000 Document Revised: 05/01/2014 Document Reviewed: 11/27/2012 Elsevier Interactive Patient Education 2016 Elsevier Inc.  

## 2015-08-31 NOTE — Progress Notes (Signed)
ROB- pt c/o rapid heart rate has gotten worse, especially when she is lying down, L arm numbness tingling

## 2015-08-31 NOTE — Progress Notes (Signed)
ROB-reports increased episodes of heartburn- now daily- took zantac last night for first time and it helped- RX sent in and instructed on use. 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 Woke her up once last week.- HRR on exam- cardiology referral placed. 

## 2015-09-13 ENCOUNTER — Telehealth: Payer: Self-pay | Admitting: Obstetrics and Gynecology

## 2015-09-13 NOTE — Telephone Encounter (Signed)
Spoke with pt advised her it sounds like early labor we discussed what to watch for she has appt 09/17/15

## 2015-09-13 NOTE — Telephone Encounter (Signed)
LOST MUCUS PLUG YESTERDAY/ NO CONTRACTIONS CONSISTENTLY/ PT IS [redacted] WKS PREGNANT

## 2015-09-16 ENCOUNTER — Ambulatory Visit (INDEPENDENT_AMBULATORY_CARE_PROVIDER_SITE_OTHER): Payer: BLUE CROSS/BLUE SHIELD | Admitting: Cardiology

## 2015-09-16 ENCOUNTER — Encounter: Payer: Self-pay | Admitting: Cardiology

## 2015-09-16 VITALS — BP 100/60 | HR 88 | Ht 65.0 in | Wt 173.5 lb

## 2015-09-16 DIAGNOSIS — R002 Palpitations: Secondary | ICD-10-CM | POA: Diagnosis not present

## 2015-09-16 NOTE — Patient Instructions (Addendum)
Medication Instructions:  Your physician recommends that you continue on your current medications as directed. Please refer to the Current Medication list given to you today.   Labwork: None ordered  Testing/Procedures: Your physician has requested that you have an echocardiogram. Echocardiography is a painless test that uses sound waves to create images of your heart. It provides your doctor with information about the size and shape of your heart and how well your heart's chambers and valves are working. This procedure takes approximately one hour. There are no restrictions for this procedure.  Date & Time: _______________________________________________________________  Follow-Up: Your physician recommends that you schedule a follow-up appointment as needed. We will call you with results and if necessary a follow up appointment.    Any Other Special Instructions Will Be Listed Below (If Applicable).     If you need a refill on your cardiac medications before your next appointment, please call your pharmacy.  Echocardiogram An echocardiogram, or echocardiography, uses sound waves (ultrasound) to produce an image of your heart. The echocardiogram is simple, painless, obtained within a short period of time, and offers valuable information to your health care provider. The images from an echocardiogram can provide information such as:  Evidence of coronary artery disease (CAD).  Heart size.  Heart muscle function.  Heart valve function.  Aneurysm detection.  Evidence of a past heart attack.  Fluid buildup around the heart.  Heart muscle thickening.  Assess heart valve function. LET [facility] KNOW ABOUT:  Any allergies you have.  All medicines you are taking, including vitamins, herbs, eye drops, creams, and over-the-counter medicines.  Previous problems you or members of your family have had with the use of anesthetics.  Any blood disorders you  have.  Previous surgeries you have had.  Medical conditions you have.  Possibility of pregnancy, if this applies. BEFORE THE PROCEDURE  No special preparation is needed. Eat and drink normally.  PROCEDURE   In order to produce an image of your heart, gel will be applied to your chest and a wand-like tool (transducer) will be moved over your chest. The gel will help transmit the sound waves from the transducer. The sound waves will harmlessly bounce off your heart to allow the heart images to be captured in real-time motion. These images will then be recorded.  You may need an IV to receive a medicine that improves the quality of the pictures. AFTER THE PROCEDURE You may return to your normal schedule including diet, activities, and medicines, unless your health care provider tells you otherwise.   This information is not intended to replace advice given to you by your health care provider. Make sure you discuss any questions you have with your health care provider.   Document Released: 04/07/2000 Document Revised: 05/01/2014 Document Reviewed: 12/16/2012 Elsevier Interactive Patient Education [employer] Elsevier Inc.

## 2015-09-16 NOTE — Progress Notes (Signed)
Cardiology Office Note   Date:  09/16/2015   ID:  Suzanne Ross, DOB [birth year], MRN [id number]  Referring Doctor:  Purcell Nails, CNM   Cardiologist:   Almond Lint, MD   Reason for consultation:  Chief Complaint  Patient presents with  . other    Ref by Dr. Aura Camps for intermittent palpitations & chest indigestion, is [redacted] weeks pregnant. Pt. c/o palpitations and shortness of breath. Meds reviewed by the patient verbally.       History of Present Illness: Suzanne Ross is a 25 y.o. female who presents for Palpitations. This has been going on for the last one or 2 months. Not associated with exertion. Randomly occurring. Mild in intensity. Brief in duration. She was recently started by her midwife on ranitidine and she has noted improvement in the frequency of the palpitations. No chest pain. Shortness breath related to her belly getting bigger. Nothing that is out of the ordinary.  No loss of consciousness, syncope, PND, orthopnea, significant edema.  ROS:  Please see the history of present illness. Aside from mentioned under HPI, all other systems are reviewed and negative.     Past Medical History  Diagnosis Date  . Broken neck [facility])     Past Surgical History  Procedure Laterality Date  . Rhinoplasty    . Appendectomy    . Tonsillectomy       reports that she has never smoked. She has never used smokeless tobacco. She reports that she does not drink alcohol or use illicit drugs.   family history includes Hypertension in her maternal grandmother.   Current Outpatient Prescriptions  Medication Sig Dispense Refill  . Iron-FA-B Cmp-C-Biot-Probiotic (FUSION PLUS) CAPS Take 1 capsule by mouth daily. 60 capsule 1  . Prenat-FeFum-FePo-FA-Omega 3 (CONCEPT DHA) 53.5-38-1 MG CAPS Take 1 capsule by mouth daily. 30 capsule 10  . ranitidine (ZANTAC) 150 MG tablet Take 1 tablet (150 mg total) by mouth 2 (two) times daily. 120 tablet 1   No current  facility-administered medications for this visit.    Allergies: Review of patient's allergies indicates no known allergies.    PHYSICAL EXAM: VS:  BP 100/60 mmHg  Pulse 88  Ht  (1.651 m)  Wt 173 lb 8 oz (78.699 kg)  BMI 28.87 kg/m2  LMP 01/11/2015 (Exact Date) , Body mass index is 28.87 kg/(m^2). Wt Readings from Last 3 Encounters:  09/16/15 173 lb 8 oz (78.699 kg)  08/31/15 178 lb 6.4 oz (80.922 kg)  08/17/15 175 lb 11.2 oz (79.697 kg)    GENERAL:  well developed, well nourished, not in acute distress HEENT: normocephalic, pink conjunctivae, anicteric sclerae, no xanthelasma, normal dentition, oropharynx clear NECK:  no neck vein engorgement, JVP normal, no hepatojugular reflux, carotid upstroke brisk and symmetric, no bruit, no thyromegaly, no lymphadenopathy LUNGS:  good respiratory effort, clear to auscultation bilaterally CV:  PMI not displaced, no thrills, no lifts, S1 and S2 within normal limits, no palpable S3 or S4, no murmurs, no rubs, no gallops ABD:  Gravid uterus/pregnant  MS: nontender back, no kyphosis, no scoliosis, no joint deformities EXT:  2+ DP/PT pulses, no edema, no varicosities, no cyanosis, no clubbing SKIN: warm, nondiaphoretic, normal turgor, no ulcers NEUROPSYCH: alert, oriented to person, place, and time, sensory/motor grossly intact, normal mood, appropriate affect  Recent Labs: 03/03/2015: Platelets 365; TSH 2.390   Lipid Panel No results found for: CHOL, TRIG, HDL, CHOLHDL, VLDL, LDLCALC, LDLDIRECT   Other studies Reviewed:  EKG:  The  ekg from 09/16/2015 was personally reviewed by me and it revealed sinus rhythm, 98 BPM. PR interval 102 ms.  Additional studies/ records that were reviewed personally reviewed by me today include: None available   ASSESSMENT AND PLAN:  Palpitations Somewhat improved with ranitidine. Recommend Holter monitor if persistence of palpitations. Recommend echocardiogram.  Current medicines are reviewed at  length with the patient today.  The patient does not have concerns regarding medicines.  Labs/ tests ordered today include:  Orders Placed This Encounter  Procedures  . EKG 12-Lead  . ECHOCARDIOGRAM COMPLETE    I had a lengthy and detailed discussion with the patient regarding diagnoses, prognosis, diagnostic options, treatment options.      Disposition:   FU with undersigned after testsWhen necessary  Signed, Almond LintAileen Destan Franchini, MD  09/16/2015 4:10 PM    [facility]

## 2015-09-17 ENCOUNTER — Ambulatory Visit (INDEPENDENT_AMBULATORY_CARE_PROVIDER_SITE_OTHER): Payer: BLUE CROSS/BLUE SHIELD | Admitting: Obstetrics and Gynecology

## 2015-09-17 ENCOUNTER — Encounter: Payer: Self-pay | Admitting: Obstetrics and Gynecology

## 2015-09-17 VITALS — BP 112/68 | HR 98 | Wt 174.9 lb

## 2015-09-17 DIAGNOSIS — Z113 Encounter for screening for infections with a predominantly sexual mode of transmission: Secondary | ICD-10-CM

## 2015-09-17 DIAGNOSIS — Z3493 Encounter for supervision of normal pregnancy, unspecified, third trimester: Secondary | ICD-10-CM

## 2015-09-17 DIAGNOSIS — Z36 Encounter for antenatal screening of mother: Secondary | ICD-10-CM

## 2015-09-17 DIAGNOSIS — Z3685 Encounter for antenatal screening for Streptococcus B: Secondary | ICD-10-CM

## 2015-09-17 LAB — POCT URINALYSIS DIPSTICK
BILIRUBIN UA: NEGATIVE
Blood, UA: NEGATIVE
GLUCOSE UA: NEGATIVE
KETONES UA: 15
LEUKOCYTES UA: NEGATIVE
Nitrite, UA: NEGATIVE
PROTEIN UA: NEGATIVE
SPEC GRAV UA: 1.01
Urobilinogen, UA: 0.2
pH, UA: 7

## 2015-09-17 NOTE — Addendum Note (Signed)
Addended by: Rosine BeatLONTZ, Icey Tello L on: 09/17/2015 04:50 PM   Modules accepted: Orders

## 2015-09-17 NOTE — Progress Notes (Signed)
ROB- labor precautions discussed, cultures obtained.

## 2015-09-17 NOTE — Progress Notes (Signed)
ROB-pt is having a lot of contractions, lots of pelvic pressure

## 2015-09-18 ENCOUNTER — Encounter: Payer: Self-pay | Admitting: *Deleted

## 2015-09-18 ENCOUNTER — Observation Stay
Admission: EM | Admit: 2015-09-18 | Discharge: 2015-09-18 | Disposition: A | Discharge status: Home / Self Care | Payer: BLUE CROSS/BLUE SHIELD | Attending: Obstetrics and Gynecology | Admitting: Obstetrics and Gynecology

## 2015-09-18 DIAGNOSIS — O99019 Anemia complicating pregnancy, unspecified trimester: Secondary | ICD-10-CM

## 2015-09-18 DIAGNOSIS — Z3A35 35 weeks gestation of pregnancy: Secondary | ICD-10-CM | POA: Diagnosis not present

## 2015-09-18 DIAGNOSIS — O479 False labor, unspecified: Secondary | ICD-10-CM | POA: Diagnosis present

## 2015-09-18 DIAGNOSIS — O4703 False labor before 37 completed weeks of gestation, third trimester: Principal | ICD-10-CM | POA: Insufficient documentation

## 2015-09-18 HISTORY — DX: Other specified health status: Z78.9

## 2015-09-18 NOTE — Final Progress Note (Signed)
L&D OB Triage Note  SUBJECTIVE Suzanne Ross is a 25 y.o. G1P0 female at 3491w5d, EDD Estimated Date of Delivery: 10/18/15 who presented to triage with complaints of contractions.    OBJECTIVE Nursing Evaluation: BP 105/67 mmHg  Pulse 89  Temp(Src) 98 F (36.7 C) (Oral)  Resp 16  Ht 5\' 5"  (1.651 m)  Wt 174 lb 14.4 oz (79.334 kg)  BMI 29.10 kg/m2  LMP 01/11/2015 (Exact Date) no significant findings for labor Cervix unchanged Patient declines betamethasone (per RN)  NST was performed and has been reviewed by me.  NST INTERPRETATION: Indications: Contractions  Mode: External Baseline Rate (A): 130 bpm Variability: Moderate Accelerations: 15 x 15 Decelerations: None     Contraction Frequency (min): 8-9  ASSESSMENT Impression:  1. Pregnancy:  G1P0 at 4891w5d , EDD Estimated Date of Delivery: 10/18/15 2.  reactive NST 3. No evidence of labor  PLAN 1. Reassurance given 2. Discharge home with bleeding/labor precautions.  3. Continue routine prenatal care.   Herold HarmsMartin A Defrancesco, MD

## 2015-09-18 NOTE — OB Triage Note (Signed)
C/o ctx "all night". Was seen @ office yesterday and was dilated 1.5/100%, per patient. Elaina HoopsElks, Shaunae Sieloff S

## 2015-09-19 ENCOUNTER — Inpatient Hospital Stay
Admission: EM | Admit: 2015-09-19 | Discharge: 2015-09-19 | Disposition: A | Discharge status: Home / Self Care | Payer: BLUE CROSS/BLUE SHIELD | Attending: Obstetrics and Gynecology | Admitting: Obstetrics and Gynecology

## 2015-09-19 DIAGNOSIS — Z3A35 35 weeks gestation of pregnancy: Secondary | ICD-10-CM | POA: Diagnosis not present

## 2015-09-19 DIAGNOSIS — R5383 Other fatigue: Secondary | ICD-10-CM | POA: Diagnosis not present

## 2015-09-19 DIAGNOSIS — M549 Dorsalgia, unspecified: Secondary | ICD-10-CM | POA: Diagnosis present

## 2015-09-19 DIAGNOSIS — O26893 Other specified pregnancy related conditions, third trimester: Secondary | ICD-10-CM | POA: Insufficient documentation

## 2015-09-19 DIAGNOSIS — O99019 Anemia complicating pregnancy, unspecified trimester: Secondary | ICD-10-CM

## 2015-09-19 LAB — STREP GP B NAA: Strep Gp B NAA: NEGATIVE

## 2015-09-19 MED ORDER — TERBUTALINE SULFATE 1 MG/ML IJ SOLN
0.2500 mg | Freq: Once | INTRAMUSCULAR | Status: AC
  Administered 2015-09-19: 0.25 mg via SUBCUTANEOUS

## 2015-09-19 MED ORDER — TERBUTALINE SULFATE 1 MG/ML IJ SOLN
INTRAMUSCULAR | Status: AC
  Administered 2015-09-19: 0.25 mg via SUBCUTANEOUS
  Filled 2015-09-19: qty 1

## 2015-09-19 NOTE — Discharge Planning (Signed)
Discharge instructions reviewed with patient including follow up appointments, signs of preterm labor, and when to seek medical attention. Patient discharged home, ambulatory with steady gait, escorted by significant other. No signs of distress observed at time of discharge.

## 2015-09-19 NOTE — OB Triage Provider Note (Signed)
Subjective:  Carlena Hurlicole Florido is a 25 y.o. G1P0 at 679w6d being seen today for ongoing prenatal care.  Patient reports backache, contractions since 1400 that are now consistant and painful and nausea.   .  Vag. Bleeding: None.  . Denies leaking of fluid.   The following portions of the patient's history were reviewed and updated as appropriate: allergies, current medications, past family history, past medical history, past social history, past surgical history and problem list.   Objective:  There were no vitals filed for this visit.  Fetal Status:        Presentation: Vertex  General:  Alert, oriented and cooperative. Patient is in no acute distress. Fatigued, but pleasant   Skin: Skin is warm and dry. No rash noted.   Cardiovascular: Normal heart rate noted  Respiratory: Effort and breath sounds normal, no problems with respiration noted  Abdomen: Soft, gravid, appropriate for gestational age.       Vaginal: Vag. Bleeding: None.       Cervix: Dilation: 1.5 Effacement (%): 100 Station: -1  Extremities: Normal range of motion.     Mental Status: Normal mood and affect. Normal behavior. Normal judgment and thought content.   Urinalysis:    not indicated Last menstrual period 01/11/2015. Assessment and Plan:  Pregnancy: G1P0 at 159w6d with prodromal preterm contractions and fatigue.  Preterm labor symptoms and general obstetric precautions including but not limited to vaginal bleeding, contractions, leaking of fluid and fetal movement were reviewed in detail with the patient. Decline betamethasone injection due to late preterm. Does consent to terbutaline injections and PO hydration. Will administer and discharge to home if contractions settle down.  Please refer to After Visit Summary for other counseling recommendations.   No Follow-up on file. will keep scheduled ROB appointment this Thursday.   Brigett Estell Suzan NailerN Krishana Lutze, CNM

## 2015-09-21 LAB — GC/CHLAMYDIA PROBE AMP
Chlamydia trachomatis, NAA: NEGATIVE
NEISSERIA GONORRHOEAE BY PCR: NEGATIVE

## 2015-09-23 ENCOUNTER — Ambulatory Visit (INDEPENDENT_AMBULATORY_CARE_PROVIDER_SITE_OTHER): Payer: BLUE CROSS/BLUE SHIELD | Admitting: Obstetrics and Gynecology

## 2015-09-23 ENCOUNTER — Encounter: Payer: Self-pay | Admitting: Obstetrics and Gynecology

## 2015-09-23 VITALS — BP 115/76 | HR 94 | Wt 178.5 lb

## 2015-09-23 DIAGNOSIS — Z3493 Encounter for supervision of normal pregnancy, unspecified, third trimester: Secondary | ICD-10-CM

## 2015-09-23 LAB — POCT URINALYSIS DIPSTICK
Bilirubin, UA: NEGATIVE
Ketones, UA: NEGATIVE
Leukocytes, UA: NEGATIVE
Nitrite, UA: NEGATIVE
PROTEIN UA: NEGATIVE
RBC UA: NEGATIVE
SPEC GRAV UA: 1.01
Urobilinogen, UA: 0.2
pH, UA: 6

## 2015-09-23 NOTE — Progress Notes (Signed)
ROB- doing well, reviewed negative GBS, labor precautions reiterated.

## 2015-09-23 NOTE — Progress Notes (Signed)
ROB- pt is having lots of pelvic pressure, low back pain, pressure in her rectum

## 2015-09-28 ENCOUNTER — Encounter: Payer: Self-pay | Admitting: Obstetrics and Gynecology

## 2015-09-28 ENCOUNTER — Ambulatory Visit (INDEPENDENT_AMBULATORY_CARE_PROVIDER_SITE_OTHER): Payer: BLUE CROSS/BLUE SHIELD | Admitting: Obstetrics and Gynecology

## 2015-09-28 VITALS — BP 102/70 | HR 101 | Wt 177.3 lb

## 2015-09-28 DIAGNOSIS — Z36 Encounter for antenatal screening of mother: Secondary | ICD-10-CM

## 2015-09-28 DIAGNOSIS — Z369 Encounter for antenatal screening, unspecified: Secondary | ICD-10-CM

## 2015-09-28 DIAGNOSIS — Z1389 Encounter for screening for other disorder: Secondary | ICD-10-CM

## 2015-09-28 LAB — POCT URINALYSIS DIPSTICK
BILIRUBIN UA: NEGATIVE
Blood, UA: NEGATIVE
Glucose, UA: NEGATIVE
Ketones, UA: NEGATIVE
NITRITE UA: NEGATIVE
PH UA: 6
PROTEIN UA: NEGATIVE
Spec Grav, UA: 1.01
UROBILINOGEN UA: NEGATIVE

## 2015-09-28 NOTE — Progress Notes (Signed)
ROB- no changes, contractions daily but no regularity to them. Is having trouble sleeping.

## 2015-09-30 ENCOUNTER — Encounter: Payer: Self-pay | Admitting: Obstetrics and Gynecology

## 2015-09-30 ENCOUNTER — Ambulatory Visit (INDEPENDENT_AMBULATORY_CARE_PROVIDER_SITE_OTHER): Payer: BLUE CROSS/BLUE SHIELD

## 2015-09-30 ENCOUNTER — Telehealth: Payer: Self-pay | Admitting: Obstetrics and Gynecology

## 2015-09-30 ENCOUNTER — Ambulatory Visit (INDEPENDENT_AMBULATORY_CARE_PROVIDER_SITE_OTHER): Payer: BLUE CROSS/BLUE SHIELD | Admitting: Obstetrics and Gynecology

## 2015-09-30 ENCOUNTER — Other Ambulatory Visit: Payer: Self-pay

## 2015-09-30 VITALS — BP 121/79 | HR 87 | Wt 179.6 lb

## 2015-09-30 DIAGNOSIS — Z369 Encounter for antenatal screening, unspecified: Secondary | ICD-10-CM

## 2015-09-30 DIAGNOSIS — Z1389 Encounter for screening for other disorder: Secondary | ICD-10-CM | POA: Diagnosis not present

## 2015-09-30 DIAGNOSIS — Z36 Encounter for antenatal screening of mother: Secondary | ICD-10-CM

## 2015-09-30 DIAGNOSIS — R002 Palpitations: Secondary | ICD-10-CM

## 2015-09-30 LAB — POCT URINALYSIS DIPSTICK
Bilirubin, UA: NEGATIVE
Blood, UA: NEGATIVE
GLUCOSE UA: NEGATIVE
Ketones, UA: NEGATIVE
LEUKOCYTES UA: NEGATIVE
Nitrite, UA: NEGATIVE
Protein, UA: NEGATIVE
SPEC GRAV UA: 1.01
UROBILINOGEN UA: NEGATIVE
pH, UA: 6

## 2015-09-30 NOTE — Progress Notes (Signed)
Work-in OB- reports small gush of clear fluid this am, with increased mucus since then. NTZ negative and Fern Negative, bulging BOW noted on sterile speculum exam. Reassure patient, but instructed her to notify us if more is noted.

## 2015-09-30 NOTE — Telephone Encounter (Signed)
Pt left msg .... said she thinks her water is leaking/ no pain right now. Her underwear are wet and gross.

## 2015-09-30 NOTE — Telephone Encounter (Signed)
Started at 9am today. Changing underwear because of ?fluid, contractions no different than they have been, ?vaginal discharge. Pt to put on a regular pad and keep up with how many times she is changing it and to been seen in office at 1:30p today.

## 2015-10-01 LAB — ECHOCARDIOGRAM COMPLETE
AVLVOTPG: 5 mmHg
CHL CUP DOP CALC LVOT VTI: 21 cm
CHL CUP MV DEC (S): 261
EWDT: 261 ms
FS: 30 % (ref 28–44)
IVS/LV PW RATIO, ED: 0.92
LA ID, A-P, ES: 26 mm
LA vol A4C: 41 ml
LA vol index: 19.5 mL/m2
LADIAMINDEX: 1.33 cm/m2
LAVOL: 38.1 mL
LEFT ATRIUM END SYS DIAM: 26 mm
LV PW d: 8.49 mm — AB (ref 0.6–1.1)
LVOT area: 3.14 cm2
LVOTD: 20 mm
LVOTPV: 116 cm/s
LVOTSV: 66 mL
MV Peak grad: 2 mmHg
MV pk A vel: 47.1 m/s
MV pk E vel: 75.2 m/s
RV TAPSE: 25.6 mm
Weight: 2873 oz

## 2015-10-05 ENCOUNTER — Ambulatory Visit (INDEPENDENT_AMBULATORY_CARE_PROVIDER_SITE_OTHER): Payer: BLUE CROSS/BLUE SHIELD | Admitting: Obstetrics and Gynecology

## 2015-10-05 ENCOUNTER — Encounter: Payer: BLUE CROSS/BLUE SHIELD | Admitting: Obstetrics and Gynecology

## 2015-10-05 ENCOUNTER — Encounter: Payer: Self-pay | Admitting: Obstetrics and Gynecology

## 2015-10-05 VITALS — BP 123/76 | HR 97 | Wt 179.9 lb

## 2015-10-05 DIAGNOSIS — Z3493 Encounter for supervision of normal pregnancy, unspecified, third trimester: Secondary | ICD-10-CM

## 2015-10-05 LAB — POCT URINALYSIS DIPSTICK
Bilirubin, UA: NEGATIVE
Blood, UA: NEGATIVE
Glucose, UA: NEGATIVE
Ketones, UA: NEGATIVE
LEUKOCYTES UA: NEGATIVE
NITRITE UA: NEGATIVE
PROTEIN UA: NEGATIVE
Spec Grav, UA: <=1.005
Urobilinogen, UA: 0.2
pH, UA: 7

## 2015-10-05 NOTE — Progress Notes (Signed)
ROB-No complaints.  

## 2015-10-05 NOTE — H&P (Signed)
Suzanne Ross is a 25 y.o. female presenting for elective IOL at term for advanced cervical dilation. History OB History    Gravida Para Term Preterm AB TAB SAB Ectopic Multiple Living   1 0             Past Medical History  Diagnosis Date  . Broken neck ([place])   . Medical history non-contributory    Past Surgical History  Procedure Laterality Date  . Rhinoplasty    . Appendectomy    . Tonsillectomy    . Ankle fracture surgery Left    Family History: family history includes Hypertension in her maternal grandmother. Social History:  reports that she has never smoked. She has never used smokeless tobacco. She reports that she does not drink alcohol or use illicit drugs.   Prenatal Transfer Tool  Maternal Diabetes: No Genetic Screening: Normal Maternal Ultrasounds/Referrals: Normal Fetal Ultrasounds or other Referrals:  None Maternal Substance Abuse:  No Significant Maternal Medications:  None Significant Maternal Lab Results:  None Other Comments:  None  ROS  Dilation: 4 Effacement (%): 90 Station: -2 Blood pressure 123/76, pulse 97, weight 179 lb 14.4 oz (81.602 kg), last menstrual period 01/11/2015. Exam Physical Exam  A&O x4  well groomed female in no distress HRR lungs clear bilateral Abdomen gravid but non-tender Mild non-pitting edema  Prenatal labs: ABO, Rh: O/Positive/-- (11/09 1641) Antibody: Negative (11/09 1641) Rubella: <20.0 (11/09 1641) RPR: Non Reactive (11/09 1641)  HBsAg: Negative (11/09 1641)  HIV: Non Reactive (11/09 1641)  GBS: Negative (05/26 1651)   Assessment/Plan: Elective IOL at term- planned pitocin and AROM.   Suzanne Ross N Suzanne Ross 10/05/2015, 12:38 PM

## 2015-10-05 NOTE — Progress Notes (Signed)
ROB- discussed IOL and will plan 6/21 if not delivered by then.

## 2015-10-06 ENCOUNTER — Inpatient Hospital Stay
Admission: EM | Admit: 2015-10-06 | Discharge: 2015-10-07 | DRG: 775 | Disposition: A | Discharge status: Home / Self Care | Payer: BLUE CROSS/BLUE SHIELD | Attending: Obstetrics and Gynecology | Admitting: Obstetrics and Gynecology

## 2015-10-06 DIAGNOSIS — Z3403 Encounter for supervision of normal first pregnancy, third trimester: Secondary | ICD-10-CM | POA: Diagnosis not present

## 2015-10-06 DIAGNOSIS — O99019 Anemia complicating pregnancy, unspecified trimester: Secondary | ICD-10-CM

## 2015-10-06 DIAGNOSIS — Z3A38 38 weeks gestation of pregnancy: Secondary | ICD-10-CM

## 2015-10-06 LAB — CBC
HEMATOCRIT: 36.4 % (ref 35.0–47.0)
HEMOGLOBIN: 12 g/dL (ref 12.0–16.0)
MCH: 30 pg (ref 26.0–34.0)
MCHC: 32.9 g/dL (ref 32.0–36.0)
MCV: 91.3 fL (ref 80.0–100.0)
Platelets: 254 10*3/uL (ref 150–440)
RBC: 3.99 MIL/uL (ref 3.80–5.20)
RDW: 14.3 % (ref 11.5–14.5)
WBC: 26.1 10*3/uL — ABNORMAL HIGH (ref 3.6–11.0)

## 2015-10-06 MED ORDER — PRENATAL MULTIVITAMIN CH
1.0000 | ORAL_TABLET | Freq: Every day | ORAL | Status: DC
  Administered 2015-10-06 – 2015-10-07 (×2): 1 via ORAL
  Filled 2015-10-06 (×2): qty 1

## 2015-10-06 MED ORDER — SOD CITRATE-CITRIC ACID 500-334 MG/5ML PO SOLN: 30.0000 mL | ORAL | Status: DC | PRN

## 2015-10-06 MED ORDER — DIPHENHYDRAMINE HCL 25 MG PO CAPS: 25.0000 mg | ORAL_CAPSULE | Freq: Four times a day (QID) | ORAL | Status: DC | PRN

## 2015-10-06 MED ORDER — SENNOSIDES-DOCUSATE SODIUM 8.6-50 MG PO TABS: 2.0000 | ORAL_TABLET | ORAL | Status: DC

## 2015-10-06 MED ORDER — BENZOCAINE-MENTHOL 20-0.5 % EX AERO
1.0000 "application " | INHALATION_SPRAY | CUTANEOUS | Status: DC | PRN
  Administered 2015-10-06 – 2015-10-07 (×2): 1 via TOPICAL
  Filled 2015-10-06 (×2): qty 56

## 2015-10-06 MED ORDER — ACETAMINOPHEN 325 MG PO TABS: 650.0000 mg | ORAL_TABLET | ORAL | Status: DC | PRN

## 2015-10-06 MED ORDER — IBUPROFEN 600 MG PO TABS
600.0000 mg | ORAL_TABLET | Freq: Four times a day (QID) | ORAL | Status: DC
  Administered 2015-10-06 – 2015-10-07 (×6): 600 mg via ORAL
  Filled 2015-10-06 (×6): qty 1

## 2015-10-06 MED ORDER — LIDOCAINE HCL (PF) 1 % IJ SOLN: 30.0000 mL | INTRAMUSCULAR | Status: DC | PRN

## 2015-10-06 MED ORDER — SIMETHICONE 80 MG PO CHEW: 80.0000 mg | CHEWABLE_TABLET | ORAL | Status: DC | PRN

## 2015-10-06 MED ORDER — ONDANSETRON HCL 4 MG PO TABS: 4.0000 mg | ORAL_TABLET | ORAL | Status: DC | PRN

## 2015-10-06 MED ORDER — OXYCODONE HCL 5 MG PO TABS: 5.0000 mg | ORAL_TABLET | ORAL | Status: DC | PRN

## 2015-10-06 MED ORDER — ZOLPIDEM TARTRATE 5 MG PO TABS: 5.0000 mg | ORAL_TABLET | Freq: Every evening | ORAL | Status: DC | PRN

## 2015-10-06 MED ORDER — FENTANYL CITRATE (PF) 100 MCG/2ML IJ SOLN: 50.0000 ug | INTRAMUSCULAR | Status: DC | PRN

## 2015-10-06 MED ORDER — COCONUT OIL OIL
1.0000 "application " | TOPICAL_OIL | Status: DC | PRN
  Administered 2015-10-07: 1 via TOPICAL
  Filled 2015-10-06: qty 120

## 2015-10-06 MED ORDER — OXYCODONE-ACETAMINOPHEN 5-325 MG PO TABS: 2.0000 | ORAL_TABLET | ORAL | Status: DC | PRN

## 2015-10-06 MED ORDER — DIBUCAINE 1 % RE OINT: 1.0000 "application " | TOPICAL_OINTMENT | RECTAL | Status: DC | PRN

## 2015-10-06 MED ORDER — OXYCODONE HCL 5 MG PO TABS: 10.0000 mg | ORAL_TABLET | ORAL | Status: DC | PRN

## 2015-10-06 MED ORDER — MEASLES, MUMPS & RUBELLA VAC ~~LOC~~ INJ
0.5000 mL | INJECTION | Freq: Once | SUBCUTANEOUS | Status: DC
  Filled 2015-10-06: qty 0.5

## 2015-10-06 MED ORDER — ONDANSETRON HCL 4 MG/2ML IJ SOLN: 4.0000 mg | INTRAMUSCULAR | Status: DC | PRN

## 2015-10-06 MED ORDER — OXYCODONE-ACETAMINOPHEN 5-325 MG PO TABS: 1.0000 | ORAL_TABLET | ORAL | Status: DC | PRN

## 2015-10-06 MED ORDER — DOCUSATE SODIUM 100 MG PO CAPS
100.0000 mg | ORAL_CAPSULE | Freq: Two times a day (BID) | ORAL | Status: DC
  Administered 2015-10-07 (×2): 100 mg via ORAL
  Filled 2015-10-06 (×2): qty 1

## 2015-10-06 MED ORDER — WITCH HAZEL-GLYCERIN EX PADS: 1.0000 "application " | MEDICATED_PAD | CUTANEOUS | Status: DC | PRN

## 2015-10-06 MED ORDER — ONDANSETRON HCL 4 MG/2ML IJ SOLN: 4.0000 mg | Freq: Four times a day (QID) | INTRAMUSCULAR | Status: DC | PRN

## 2015-10-06 NOTE — Progress Notes (Signed)
Suzanne Ross is a 25 y.o. G1P0 at 448w2d by LMP admitted for active labor & SROM at home at 2300 with clear fluid  Subjective: Reports pressure and urge to push with each contraction. All pain in lower back  Objective: BP 122/66 mmHg  Pulse 77  Temp(Src) 97.6 F (36.4 C) (Oral)  Resp 20  Ht 5\' 5"  (1.651 m)  Wt 179 lb (81.194 kg)  BMI 29.79 kg/m2  LMP 01/11/2015 (Exact Date)      FHT:  FHR: 122 bpm, variability: moderate,  accelerations:  Present,  decelerations:  Present early UC:   regular, every 2 minutes, moderate to palpation SVE:   Dilation: 8 Effacement (%): 90 Station: 0 Exam by:: Suzanne Ross CNM  Labs: Lab Results  Component Value Date   WBC 10.0 03/03/2015   HCT 31.6* 07/22/2015   MCV 90 03/03/2015   PLT 365 03/03/2015    Assessment / Plan: Spontaneous labor, progressing normally  Labor: Progressing normally Preeclampsia:  labs stable Fetal Wellbeing:  Category I Pain Control:  Labor support without medications I/D:  n/a Anticipated MOD:  NSVD  Suzanne Ross Suzanne Ross Suzanne Ross, CNM 10/06/2015, 12:59 AM

## 2015-10-06 NOTE — Progress Notes (Signed)
Post Partum 9 hours Subjective: no complaints, up ad lib and voiding  Objective: Blood pressure 106/53, pulse 74, temperature 98 F (36.7 C), temperature source Oral, resp. rate 18, height 5\' 5"  (1.651 m), weight 179 lb (81.194 kg), last menstrual period 01/11/2015, SpO2 100 %, unknown if currently breastfeeding.  Physical Exam:  General: alert, cooperative, appears stated age and fatigued Lochia: appropriate Uterine Fundus: firm Incision: NA DVT Evaluation: No evidence of DVT seen on physical exam.   Recent Labs  10/06/15 0528  HGB 12.0  HCT 36.4    Assessment/Plan: Breastfeeding Infant feeding only Breast;     LOS: 0 days   [employer] MicrosystemsMelody N Celene Pippins, CNM 10/06/2015, 12:45 PM

## 2015-10-07 LAB — VARICELLA ZOSTER ANTIBODY, IGG: VARICELLA IGG: 181 {index} (ref >165)

## 2015-10-07 MED ORDER — DOCUSATE SODIUM 100 MG PO CAPS: 100.0000 mg | ORAL_CAPSULE | Freq: Every day | ORAL | Status: DC

## 2015-10-07 MED ORDER — IBUPROFEN 600 MG PO TABS: 600.0000 mg | ORAL_TABLET | Freq: Four times a day (QID) | ORAL | Status: DC | PRN

## 2015-10-07 MED ORDER — VITAMIN D3 125 MCG (5000 UT) PO CAPS: 1.0000 | ORAL_CAPSULE | Freq: Every day | ORAL | Status: DC

## 2015-10-07 MED ORDER — VARICELLA VIRUS VACCINE LIVE 1350 PFU/0.5ML IJ SUSR
0.5000 mL | Freq: Once | INTRAMUSCULAR | Status: DC
  Filled 2015-10-07: qty 0.5

## 2015-10-07 NOTE — Discharge Summary (Signed)
Obstetric Discharge Summary Reason for Admission: onset of labor Prenatal Procedures: ultrasound Intrapartum Procedures: spontaneous vaginal delivery Postpartum Procedures: Rubella Ig and varicella vaccine Complications-Operative and Postpartum: 1st degree and left labial degree perineal laceration HEMOGLOBIN  Date Value Ref Range Status  10/06/2015 12.0 12.0 - 16.0 g/dL Final   HCT  Date Value Ref Range Status  10/06/2015 36.4 35.0 - 47.0 % Final   HEMATOCRIT  Date Value Ref Range Status  07/22/2015 31.6* 34.0 - 46.6 % Final    Physical Exam:  General: alert, cooperative and appears stated age 45Lochia: appropriate Uterine Fundus: firm Incision: NA DVT Evaluation: No evidence of DVT seen on physical exam.  Discharge Diagnoses: Term Pregnancy-delivered  Discharge Information: Date: 10/07/2015 Activity: pelvic rest Diet: routine Medications: PNV, Ibuprofen, Colace, Iron and vit D3 Condition: stable Instructions: refer to practice specific booklet Discharge to: home   Newborn Data: Live born female -circumcised today- breast feeding Birth Weight: 6 lb 10.5 oz (3020 g) APGAR: 8, 9  Home with mother.  Suzanne Ross 10/07/2015, 1:09 PM

## 2015-10-07 NOTE — Lactation Note (Signed)
This note was copied from a baby's chart. Lactation Consultation Note  Patient Name: Boy Carlena Hurlicole Nutter [id number]'s Date: 10/07/2015 Reason for consult: Follow-up assessment   Maternal Data Has patient been taught Hand Expression?: Yes Does the patient have breastfeeding experience prior to this delivery?: Yes  Feeding Feeding Type: Breast Fed  LATCH Score/Interventions Latch: Repeated attempts needed to sustain latch, nipple held in mouth throughout feeding, stimulation needed to elicit sucking reflex. Intervention(s): Adjust position;Assist with latch  Audible Swallowing: Spontaneous and intermittent Intervention(s): Skin to skin  Type of Nipple: Everted at rest and after stimulation  Comfort (Breast/Nipple): Soft / non-tender     Hold (Positioning): Assistance needed to correctly position infant at breast and maintain latch. Intervention(s): Breastfeeding basics reviewed;Support Pillows;Position options  LATCH Score: 8  Lactation Tools Discussed/Used Tools: Nipple Dorris CarnesShields (only on left) Nipple shield size: 24   Consult Status      Gilman SchmidtCarolyn P Jonaya Freshour 10/07/2015, 11:30 AM

## 2015-10-07 NOTE — Progress Notes (Signed)
Discharge instructions complete. Patient verbalizes understanding of teaching. Patient discharged home at 1615.

## 2015-10-12 ENCOUNTER — Encounter: Payer: BLUE CROSS/BLUE SHIELD | Admitting: Obstetrics and Gynecology

## 2015-10-14 ENCOUNTER — Encounter: Payer: BLUE CROSS/BLUE SHIELD | Admitting: Obstetrics and Gynecology

## 2015-10-19 ENCOUNTER — Encounter: Payer: BLUE CROSS/BLUE SHIELD | Admitting: Obstetrics and Gynecology

## 2015-10-28 ENCOUNTER — Telehealth: Payer: Self-pay | Admitting: Obstetrics and Gynecology

## 2015-10-28 NOTE — Telephone Encounter (Signed)
Sound OK and likely part of the healing process, have her apply tucks pads to the area as it should help

## 2015-10-28 NOTE — Telephone Encounter (Signed)
Spoke with patient again and she stated the pain is on the labia nest to clitoris, she states it is swollen, red and itchy.

## 2015-10-28 NOTE — Telephone Encounter (Signed)
Notified pt she voiced understanding 

## 2015-10-28 NOTE — Telephone Encounter (Signed)
Patient left a message stating she is having some post partum pain and isnt sure if its normal. She would like a call back. Thanks

## 2015-10-28 NOTE — Telephone Encounter (Signed)
pls advise

## 2015-11-09 ENCOUNTER — Ambulatory Visit (INDEPENDENT_AMBULATORY_CARE_PROVIDER_SITE_OTHER): Payer: BLUE CROSS/BLUE SHIELD | Admitting: Obstetrics and Gynecology

## 2015-11-09 ENCOUNTER — Encounter: Payer: Self-pay | Admitting: Obstetrics and Gynecology

## 2015-11-09 VITALS — BP 118/72 | HR 76 | Wt 159.0 lb

## 2015-11-09 DIAGNOSIS — Z30430 Encounter for insertion of intrauterine contraceptive device: Secondary | ICD-10-CM

## 2015-11-09 NOTE — Patient Instructions (Signed)
IUD PLACEMENT POST-PROCEDURE INSTRUCTIONS  1. You may take Ibuprofen, Aleve or Tylenol for pain if needed.  Cramping should resolve within in 24 hours.  2. You may have a small amount of spotting.  You should wear a mini pad for the next few days.  3. You may have intercourse after 24 hours.  If you using this for birth control, it is effective immediately.  4. You need to call if you have any pelvic pain, fever, heavy bleeding or foul smelling vaginal discharge.  Irregular bleeding is common the first several months after having an IUD placed. You do not need to call for this reason unless you are concerned.  5. Shower or bathe as normal   

## 2015-11-09 NOTE — Progress Notes (Signed)
Carlena Hurlicole Quigley is a 25 y.o. year old [id number] Caucasian female who presents for placement of a Paragard IUD.  No LMP recorded. BP 118/72 mmHg  Pulse 76  Wt 159 lb (72.122 kg)  Breastfeeding? Yes Last sexual intercourse was before delivery, and pregnancy test today was negative  The risks and benefits of the method and placement have been thouroughly reviewed with the patient and all questions were answered.  Specifically the patient is aware of failure rate of 04/998, expulsion of the IUD and of possible perforation.  The patient is aware of irregular bleeding due to the method and understands the incidence of irregular bleeding diminishes with time.  Signed copy of informed consent in chart.   Time out was performed.  A graves speculum was placed in the vagina.  The cervix was visualized, prepped using Betadine, and grasped with a single tooth tenaculum. The uterus was found to be neutral and it sounded to 8 cm.   IUD placed per manufacturer's recommendations.   The strings were trimmed to 3 cm.  The patient was given post procedure instructions, including signs and symptoms of infection and to check for the strings after each menses or each month, and refraining from intercourse or anything in the vagina for 3 days.    [name] Suzan NailerN Shambley, CNM

## 2015-11-16 ENCOUNTER — Ambulatory Visit: Payer: BLUE CROSS/BLUE SHIELD | Admitting: Obstetrics and Gynecology

## 2015-11-18 ENCOUNTER — Encounter: Payer: Self-pay | Admitting: Obstetrics and Gynecology

## 2015-11-18 ENCOUNTER — Ambulatory Visit (INDEPENDENT_AMBULATORY_CARE_PROVIDER_SITE_OTHER): Payer: BLUE CROSS/BLUE SHIELD | Admitting: Obstetrics and Gynecology

## 2015-11-18 NOTE — Progress Notes (Signed)
  Subjective:     Suzanne Ross is a 25 y.o. female who presents for a postpartum visit. She is 6 weeks postpartum following a spontaneous vaginal delivery. I have fully reviewed the prenatal and intrapartum course. The delivery was at 38 gestational weeks. Outcome: spontaneous vaginal delivery. Anesthesia: none. Postpartum course has been uncomplicated. Baby's course has been uncomplicated. Baby is feeding by breast. Bleeding no bleeding. Bowel function is normal. Bladder function is normal. Patient is not sexually active. Contraception method is abstinence and IUD. Postpartum depression screening: negative.  The following portions of the patient's history were reviewed and updated as appropriate: allergies, current medications, past family history, past medical history, past social history, past surgical history and problem list.  Review of Systems A comprehensive review of systems was negative.   Objective:    BP 102/64   Pulse 85   Ht 5\' 5"  (1.651 m)   Wt 155 lb 1.6 oz (70.4 kg)   Breastfeeding? Yes   BMI 25.81 kg/m   General:  alert, cooperative and appears stated age   Breasts:  inspection negative, no nipple discharge or bleeding, no masses or nodularity palpable  Lungs: clear to auscultation bilaterally  Heart:  regular rate and rhythm, S1, S2 normal, no murmur, click, rub or gallop  Abdomen: soft, non-tender; bowel sounds normal; no masses,  no organomegaly   Vulva:  normal  Vagina: normal vagina, no discharge, exudate, lesion, or erythema  Cervix:  multiparous appearance and IUD string noted  Corpus: normal size, contour, position, consistency, mobility, non-tender  Adnexa:  no mass, fullness, tenderness  Rectal Exam: Not performed.        Assessment:     6 weeks postpartum exam. Pap smear not done at today's visit.   Plan:    1. Contraception: IUD 2. Breastfeeding amenorrhea 3. Follow up in:  as needed.

## 2015-11-18 NOTE — Patient Instructions (Signed)
  Place postpartum visit patient instructions here.  

## 2015-11-19 ENCOUNTER — Other Ambulatory Visit: Payer: BLUE CROSS/BLUE SHIELD

## 2015-11-20 LAB — CBC
HEMATOCRIT: 39.4 % (ref 34.0–46.6)
Hemoglobin: 12.9 g/dL (ref 11.1–15.9)
MCH: 29.6 pg (ref 26.6–33.0)
MCHC: 32.7 g/dL (ref 31.5–35.7)
MCV: 90 fL (ref 79–97)
PLATELETS: 358 10*3/uL (ref 150–379)
RBC: 4.36 x10E6/uL (ref 3.77–5.28)
RDW: 13.2 % (ref 12.3–15.4)
WBC: 6.1 10*3/uL (ref 3.4–10.8)

## 2015-11-20 LAB — IRON: Iron: 71 ug/dL (ref 27–159)

## 2015-11-20 LAB — VITAMIN D 25 HYDROXY (VIT D DEFICIENCY, FRACTURES): Vit D, 25-Hydroxy: 45.3 ng/mL (ref 30.0–100.0)

## 2015-11-30 ENCOUNTER — Ambulatory Visit: Payer: BLUE CROSS/BLUE SHIELD | Admitting: Obstetrics and Gynecology

## 2015-12-08 ENCOUNTER — Telehealth: Payer: Self-pay | Admitting: Family Medicine

## 2015-12-08 NOTE — Telephone Encounter (Signed)
Patient thinks she might have dislocated a rib.  Patient states she is moving out of the [place] on [place].  She would like to know if Dr. Katrinka BlazingSmith could work her in to be seen before then.

## 2015-12-08 NOTE — Telephone Encounter (Signed)
Spoke to pt, scheduled her for 12/14/15 @1130am .

## 2015-12-13 NOTE — Progress Notes (Signed)
Tawana ScaleZach Jayesh Marbach D.O. [facility] [address] [place], [place] [address] Phone: [phone] Subjective:     CC: Rib pain  OZH:[id number]:Subjective  Suzanne Ross is a 25 y.o. female coming in with complaint of rib pain. Patient recently did have a pregnancy in head delivering back almost 3 months ago. Pregnancy was affected with anemia. Patient states After she had her child she's been having pain on the right side. States that it seems to her difficulty when she reaches certain things. Patient states it seems to radiate down the neurovascular arm. Patient states it can radiate around her thoracic spine. Denies any fever, chills, any abnormal weight loss. Patient is breast-feeding. Wondering if there is any way to hold her child differently. Patient does notice some increasing difficulty with doing daily activities because of this discomfort. Sometimes discomfort at night as well. Has not taken any medications secondary to her breast-feeding. [place].    Past Medical History:  Diagnosis Date  . Broken neck (HCC)   . Medical history non-contributory    Past Surgical History:  Procedure Laterality Date  . ANKLE FRACTURE SURGERY Left   . APPENDECTOMY    . RHINOPLASTY    . TONSILLECTOMY     Social History   Social History  . Marital status: Married    Spouse name: N/A  . Number of children: N/A  . Years of education: N/A   Social History Main Topics  . Smoking status: Never Smoker  . Smokeless tobacco: Never Used  . Alcohol use No  . Drug use: No  . Sexual activity: Yes    Partners: Male    Birth control/ protection: None   Other Topics Concern  . None   Social History Narrative  . None   No Known Allergies Family History  Problem Relation Age of Onset  . Hypertension Maternal Grandmother   . Diabetes      fob's-father    Past medical history, social, surgical and family history all reviewed in electronic medical record.  No  pertanent information unless stated regarding to the chief complaint.   Review of Systems: No headache, visual changes, nausea, vomiting, diarrhea, constipation, dizziness, abdominal pain, skin rash, fevers, chills, night sweats, weight loss, swollen lymph nodes, body aches, joint swelling, muscle aches, chest pain, shortness of breath, mood changes.   Objective  Blood pressure 112/70, pulse 70, weight 157 lb (71.2 kg), SpO2 98 %, currently breastfeeding.  General: No apparent distress alert and oriented x3 mood and affect normal, dressed appropriately.  HEENT: Pupils equal, extraocular movements intact  Respiratory: Patient's speak in full sentences and does not appear short of breath  Cardiovascular: No lower extremity edema, non tender, no erythema  Skin: Warm dry intact with no signs of infection or rash on extremities or on axial skeleton.  Abdomen: Soft nontender  Neuro: Cranial nerves II through XII are intact, neurovascularly intact in all extremities with 2+ DTRs and 2+ pulses.  Lymph: No lymphadenopathy of posterior or anterior cervical chain or axillae bilaterally.  Gait normal with good balance and coordination.  MSK:  Non tender with full range of motion and good stability and symmetric strength and tone of shoulders, elbows, wrist, hip, knee and ankles bilaterally.  Back Exam:  Inspection: Unremarkable very mild scoliosis noted. Motion: Flexion 45 deg, Extension 25 deg, Side Bending to 45 deg bilaterally,  Rotation to 45 deg bilaterally  SLR laying: Negative  XSLR laying: Negative  Palpable tenderness: Tender to palpation in the thoracolumbar juncture of the right side. Mild pain over the right sacroiliac joint. FABER: negative. Sensory change: Gross sensation intact to all lumbar and sacral dermatomes.  Reflexes: 2+ at both patellar tendons, 2+ at achilles tendons, Babinski's downgoing.  Strength at foot  Plantar-flexion: 5/5 Dorsi-flexion: 5/5 Eversion: 5/5 Inversion: 5/5    Leg strength  Quad: 5/5 Hamstring: 5/5 Hip flexor: 5/5 Hip abductors: 5/5  Gait unremarkable.  Osteopathic findings T3 extended rotated and side bent right with inhaled third rib L2 flexed rotated and side bent right   Impression and Recommendations:     This case required medical decision making of moderate complexity.      Note: This dictation was prepared with Dragon dictation along with smaller phrase technology. Any transcriptional errors that result from this process are unintentional.

## 2015-12-14 ENCOUNTER — Encounter: Payer: Self-pay | Admitting: Family Medicine

## 2015-12-14 ENCOUNTER — Ambulatory Visit (INDEPENDENT_AMBULATORY_CARE_PROVIDER_SITE_OTHER): Payer: BLUE CROSS/BLUE SHIELD | Admitting: Family Medicine

## 2015-12-14 DIAGNOSIS — M9908 Segmental and somatic dysfunction of rib cage: Secondary | ICD-10-CM | POA: Diagnosis not present

## 2015-12-14 DIAGNOSIS — M940 Chondrocostal junction syndrome [Tietze]: Secondary | ICD-10-CM

## 2015-12-14 DIAGNOSIS — M9902 Segmental and somatic dysfunction of thoracic region: Secondary | ICD-10-CM | POA: Diagnosis not present

## 2015-12-14 DIAGNOSIS — M9903 Segmental and somatic dysfunction of lumbar region: Secondary | ICD-10-CM

## 2015-12-14 DIAGNOSIS — M999 Biomechanical lesion, unspecified: Secondary | ICD-10-CM | POA: Insufficient documentation

## 2015-12-14 NOTE — Patient Instructions (Signed)
Good to see you  Have a great move and time.  Ice up to 20 minutes when you need it Exercises 3 times a week.  On wall with heels, butt shoulder and head touching for a goal of 5 minutes daily  Good shoes with rigid bottom.  Dierdre HarnessKeen, Dansko, Merrell or New balance greater then 700 Tennis ball between shoulder blades with sitting Try to always lift when things ar ein front of you and avoid twisting and lifting.  Vitamin D 4000 IU daily for 2 weeks then 2000 IU daily thereafter.  Tylenol if in a lot of pain but I know you will not take it Send me messages if you have questions.

## 2015-12-14 NOTE — Assessment & Plan Note (Signed)
Decision today to treat with OMT was based on Physical Exam  After verbal consent patient was treated with HVLA, ME, FPR techniques in thoraici, rib and lumbar areas  Patient tolerated the procedure well with improvement in symptoms  Patient given exercises, stretches and lifestyle modifications  See medications in patient instructions if given  Patient will follow up in 4 weeks

## 2015-12-14 NOTE — Assessment & Plan Note (Signed)
Discussed proper lifting techniques, we discussed scapular stabilization techniques and given home exercises. We discussed icing regimen as well as vitamin D supplementation. We discussed icing regimen. Patient will come back and see me again if she comes back. Otherwise we did discuss other possible treatment. Patient has worsening symptoms she will send me the sutures.

## 2015-12-16 ENCOUNTER — Encounter: Payer: Self-pay | Admitting: Family Medicine

## 2016-12-20 ENCOUNTER — Ambulatory Visit (INDEPENDENT_AMBULATORY_CARE_PROVIDER_SITE_OTHER): Payer: BLUE CROSS/BLUE SHIELD | Admitting: Obstetrics and Gynecology

## 2016-12-20 ENCOUNTER — Encounter: Payer: Self-pay | Admitting: Obstetrics and Gynecology

## 2016-12-20 VITALS — BP 118/78 | HR 98 | Ht 65.0 in | Wt 152.0 lb

## 2016-12-20 DIAGNOSIS — Z3490 Encounter for supervision of normal pregnancy, unspecified, unspecified trimester: Secondary | ICD-10-CM

## 2016-12-20 DIAGNOSIS — Z3041 Encounter for surveillance of contraceptive pills: Secondary | ICD-10-CM

## 2016-12-20 MED ORDER — DESOGESTREL-ETHINYL ESTRADIOL 0.15-30 MG-MCG PO TABS: 1.0000 | ORAL_TABLET | Freq: Every day | ORAL | 11 refills | Status: DC

## 2016-12-20 MED ORDER — CONCEPT DHA 53.5-38-1 MG PO CAPS: 1.0000 | ORAL_CAPSULE | Freq: Every day | ORAL | 10 refills | Status: DC

## 2016-12-20 NOTE — Progress Notes (Signed)
Subjective:     Patient ID: Suzanne Ross, female   DOB: July 09, 1990, 26 y.o.   MRN: [id number]  [facility]. Was on OCP in [place] and just moved back here. AE was done there in June and she has no concerns. Considering trial for another pregnancy next spring.  Review of Systems Negative     Objective:   Physical Exam A&Ox4 Well groomed female in no distress Blood pressure 118/78, pulse 98, height 5\' 5"  (1.651 m), weight 152 lb (68.9 kg), currently breastfeeding.  PE not indicated    Assessment:     Contraception prescription    Plan:     Refilled pills and also refilled PNV. RTC as needed or in June of 2019 for annual.  [place] Creek[place]ody Aaban Griep, [place]

## 2016-12-27 ENCOUNTER — Ambulatory Visit (INDEPENDENT_AMBULATORY_CARE_PROVIDER_SITE_OTHER): Payer: BLUE CROSS/BLUE SHIELD | Admitting: Family Medicine

## 2016-12-27 ENCOUNTER — Encounter: Payer: Self-pay | Admitting: Family Medicine

## 2016-12-27 VITALS — BP 118/74 | HR 66 | Ht 65.0 in | Wt 152.0 lb

## 2016-12-27 DIAGNOSIS — M999 Biomechanical lesion, unspecified: Secondary | ICD-10-CM

## 2016-12-27 DIAGNOSIS — M940 Chondrocostal junction syndrome [Tietze]: Secondary | ICD-10-CM

## 2016-12-27 NOTE — Progress Notes (Signed)
[facility] [address] [place], [place] [address] Phone: [phone] Subjective:     CC: Rib pain  BJY:[id number]  Suzanne Ross is a 26 y.o. female coming in with complaint of rib pain. Patient was cementing previously for a long time ago. Patient was having significant slipped rib syndrome. Was breast-feeding. Continues to. Has been about a year since we have seen her because patient was out of the country. Continues to have some dull throbbing aching pain in the mid back. Not doing any exercises. Not taking any medicines.   Past Medical History:  Diagnosis Date  . Broken neck (HCC)   . Medical history non-contributory    Past Surgical History:  Procedure Laterality Date  . ANKLE FRACTURE SURGERY Left   . APPENDECTOMY    . RHINOPLASTY    . TONSILLECTOMY     Social History   Social History  . Marital status: Married    Spouse name: N/A  . Number of children: N/A  . Years of education: N/A   Social History Main Topics  . Smoking status: Never Smoker  . Smokeless tobacco: Never Used  . Alcohol use No  . Drug use: No  . Sexual activity: Yes    Partners: Male    Birth control/ protection: None, Pill   Other Topics Concern  . None   Social History Narrative  . None   No Known Allergies Family History  Problem Relation Age of Onset  . Hypertension Maternal Grandmother   . Diabetes Unknown        fob's-father    Past medical history, social, surgical and family history all reviewed in electronic medical record.  No pertanent information unless stated regarding to the chief complaint.   Review of Systems: No headache, visual changes, nausea, vomiting, diarrhea, constipation, dizziness, abdominal pain, skin rash, fevers, chills, night sweats, weight loss, swollen lymph nodes, body aches, joint swelling,  chest pain, shortness of breath, mood changes. Positive muscle aches  Objective  Blood pressure 118/74, pulse 66, height 5\' 5"   (1.651 m), weight 152 lb (68.9 kg), currently breastfeeding.  Systems examined below as of 12/27/16 General: NAD A&O x3 mood, affect normal  HEENT: Pupils equal, extraocular movements intact no nystagmus Respiratory: not short of breath at rest or with speaking Cardiovascular: No lower extremity edema, non tender Skin: Warm dry intact with no signs of infection or rash on extremities or on axial skeleton. Abdomen: Soft nontender, no masses Neuro: Cranial nerves  intact, neurovascularly intact in all extremities with 2+ DTRs and 2+ pulses. Lymph: No lymphadenopathy appreciated today  Gait normal with good balance and coordination.  MSK: Non tender with full range of motion and good stability and symmetric strength and tone of shoulders, elbows, wrist,  knee hips and ankles bilaterally.   Back Exam:  Inspection: Unremarkable patient does have some mild scapular dysfunction noted Motion: Flexion 40 deg, Extension 25 deg, Side Bending to 35 deg bilaterally,  Rotation to 30 deg bilaterally  SLR laying: Negative  XSLR laying: Negative  Palpable tenderness: Tender to palpation. FABER: Mild tightness bilaterally. Sensory change: Gross sensation intact to all lumbar and sacral dermatomes.  Reflexes: 2+ at both patellar tendons, 2+ at achilles tendons, Babinski's downgoing.  Strength at foot  Plantar-flexion: 5/5 Dorsi-flexion: 5/5 Eversion: 5/5 Inversion: 5/5  Leg strength  Quad: 5/5 Hamstring: 5/5 Hip flexor: 5/5 Hip abductors: 5/5  Gait unremarkable.   Osteopathic findings C2 flexed rotated and side bent right T3  extended rotated and side bent right inhaled third rib T9 extended rotated and side bent left with inhaled rib L2 flexed rotated and side bent right Sacrum right on right    Impression and Recommendations:     This case required medical decision making of moderate complexity.      Note: This dictation was prepared with Dragon dictation along with smaller phrase  technology. Any transcriptional errors that result from this process are unintentional.

## 2016-12-27 NOTE — Assessment & Plan Note (Signed)
Decision today to treat with OMT was based on Physical Exam  After verbal consent patient was treated with HVLA, ME, FPR techniques in cervical, thoracic, lumbar and sacral areas  Patient tolerated the procedure well with improvement in symptoms  Patient given exercises, stretches and lifestyle modifications  See medications in patient instructions if given  Patient will follow up in 4-6 weeks 

## 2016-12-27 NOTE — Patient Instructions (Signed)
Good to see you.  Exercises 3 times a week.  Vitamin D 4000 IU daily for 1 month then 2000 IU thereafter.  You will do fine but the boy will push us some See me again in 4-6 weeks

## 2016-12-27 NOTE — Assessment & Plan Note (Signed)
Patient is more of a slipped rib syndrome. Given home exercises today. Responded well to osteopathic manipulation. We discussed over-the-counter medications a could be beneficial. Discussed icing regimen. Follow-up again in 4-6 weeks

## 2017-02-06 ENCOUNTER — Ambulatory Visit: Payer: BLUE CROSS/BLUE SHIELD | Admitting: Family Medicine

## 2017-04-30 ENCOUNTER — Ambulatory Visit: Payer: BLUE CROSS/BLUE SHIELD | Admitting: Family Medicine

## 2017-05-15 ENCOUNTER — Ambulatory Visit: Payer: Self-pay | Admitting: Family Medicine

## 2017-05-30 NOTE — Progress Notes (Signed)
[facility] [address] [place], [place] [address] Phone: [phone] Subjective:     CC: Back pain follow-up  OZH:[id number]  Suzanne Ross is a 27 y.o. female coming in with complaint of low back pain. She still has some rib pain despite stopping breast feeding back in October.  Still having discomfort.  Not as severe as what it was previously.  Continues to have the discomfort in the upper thoracic spine.  No radiation to the arms.     Past Medical History:  Diagnosis Date  . Broken neck (HCC)   . Medical history non-contributory    Past Surgical History:  Procedure Laterality Date  . ANKLE FRACTURE SURGERY Left   . APPENDECTOMY    . RHINOPLASTY    . TONSILLECTOMY     Social History   Socioeconomic History  . Marital status: Married    Spouse name: None  . Number of children: None  . Years of education: None  . Highest education level: None  Social Needs  . Financial resource strain: None  . Food insecurity - worry: None  . Food insecurity - inability: None  . Transportation needs - medical: None  . Transportation needs - non-medical: None  Occupational History  . None  Tobacco Use  . Smoking status: Never Smoker  . Smokeless tobacco: Never Used  Substance and Sexual Activity  . Alcohol use: No    Alcohol/week: 0.0 oz  . Drug use: No  . Sexual activity: Yes    Partners: Male    Birth control/protection: None, Pill  Other Topics Concern  . None  Social History Narrative  . None   No Known Allergies Family History  Problem Relation Age of Onset  . Hypertension Maternal Grandmother   . Diabetes Unknown        fob's-father     Past medical history, social, surgical and family history all reviewed in electronic medical record.  No pertanent information unless stated regarding to the chief complaint.   Review of Systems:Review of systems updated and as accurate as of 05/31/17  No headache, visual changes, nausea,  vomiting, diarrhea, constipation, dizziness, abdominal pain, skin rash, fevers, chills, night sweats, weight loss, swollen lymph nodes, body aches, joint swelling, muscle aches, chest pain, shortness of breath, mood changes.   Objective  Blood pressure 118/80, pulse (!) 101, height 5\' 5"  (1.651 m), weight 155 lb (70.3 kg), SpO2 98 %, currently breastfeeding. Systems examined below as of 05/31/17   General: No apparent distress alert and oriented x3 mood and affect normal, dressed appropriately.  HEENT: Pupils equal, extraocular movements intact  Respiratory: Patient's speak in full sentences and does not appear short of breath  Cardiovascular: No lower extremity edema, non tender, no erythema  Skin: Warm dry intact with no signs of infection or rash on extremities or on axial skeleton.  Abdomen: Soft nontender  Neuro: Cranial nerves II through XII are intact, neurovascularly intact in all extremities with 2+ DTRs and 2+ pulses.  Lymph: No lymphadenopathy of posterior or anterior cervical chain or axillae bilaterally.  Gait normal with good balance and coordination.  MSK:  Non tender with full range of motion and good stability and symmetric strength and tone of shoulders, elbows, wrist, hip, knee and ankles bilaterally.   Back Exam:  Inspection: Mild loss of lordosis Motion: Flexion 45 deg, Extension 25 deg, Side Bending to 35 deg bilaterally,  Rotation to 45 deg bilaterally  SLR laying: Negative  XSLR  laying: Negative  Palpable tenderness: More tightness in the thoracolumbar juncture as well as the left side of the back.. FABER: negative. Sensory change: Gross sensation intact to all lumbar and sacral dermatomes.  Reflexes: 2+ at both patellar tendons, 2+ at achilles tendons, Babinski's downgoing.  Strength at foot  Plantar-flexion: 5/5 Dorsi-flexion: 5/5 Eversion: 5/5 Inversion: 5/5  Leg strength  Quad: 5/5 Hamstring: 5/5 Hip flexor: 5/5 Hip abductors: 5/5  Gait  unremarkable.  Osteopathic findings C2 flexed rotated and side bent right C6 flexed rotated and side bent left T3 extended rotated and side bent leftinhaled third rib T11 extended rotated and side bent left L5 flexed rotated and side bent left Sacrum right on right      Impression and Recommendations:     This case required medical decision making of moderate complexity.      Note: This dictation was prepared with Dragon dictation along with smaller phrase technology. Any transcriptional errors that result from this process are unintentional.

## 2017-05-31 ENCOUNTER — Ambulatory Visit: Payer: BLUE CROSS/BLUE SHIELD | Admitting: Family Medicine

## 2017-05-31 ENCOUNTER — Encounter: Payer: Self-pay | Admitting: Family Medicine

## 2017-05-31 DIAGNOSIS — M999 Biomechanical lesion, unspecified: Secondary | ICD-10-CM | POA: Diagnosis not present

## 2017-05-31 DIAGNOSIS — M940 Chondrocostal junction syndrome [Tietze]: Secondary | ICD-10-CM

## 2017-05-31 NOTE — Assessment & Plan Note (Signed)
Decision today to treat with OMT was based on Physical Exam  After verbal consent patient was treated with HVLA, ME, FPR techniques in cervical, thoracic, rib, lumbar and sacral areas  Patient tolerated the procedure well with improvement in symptoms  Patient given exercises, stretches and lifestyle modifications  See medications in patient instructions if given  Patient will follow up in 4 weeks 

## 2017-05-31 NOTE — Assessment & Plan Note (Signed)
Slipped rib syndrome noted again.  We discussed posture, ergonomics, which activities doing which wants to avoid.  Patient will follow up with me again 4 weeks.

## 2017-05-31 NOTE — Patient Instructions (Signed)
Good to see you  Suzanne Ross is your friend.  On wall with heels, butt shoulder and head touching for a goal of 5 minutes daily  COntinue to work at it Use the legs when lifting the little ones See me again in 6 weeks or if doing well move it to 12 weeks

## 2017-07-12 ENCOUNTER — Encounter: Payer: Self-pay | Admitting: Family Medicine

## 2017-07-12 ENCOUNTER — Ambulatory Visit: Payer: BLUE CROSS/BLUE SHIELD | Admitting: Family Medicine

## 2017-07-12 VITALS — BP 104/72 | HR 84 | Ht 65.0 in | Wt 161.0 lb

## 2017-07-12 DIAGNOSIS — M999 Biomechanical lesion, unspecified: Secondary | ICD-10-CM | POA: Diagnosis not present

## 2017-07-12 DIAGNOSIS — M940 Chondrocostal junction syndrome [Tietze]: Secondary | ICD-10-CM | POA: Diagnosis not present

## 2017-07-12 NOTE — Assessment & Plan Note (Signed)
Continues to give him difficulty.  Did have more of a slipped rib on the right side again.  Patient does have tight hamstrings and hip flexors.  Given home exercises.  Follow-up again in 4 weeks

## 2017-07-12 NOTE — Assessment & Plan Note (Signed)
Decision today to treat with OMT was based on Physical Exam  After verbal consent patient was treated with HVLA, ME, FPR techniques in cervical, thoracic, lumbar and sacral areas  Patient tolerated the procedure well with improvement in symptoms  Patient given exercises, stretches and lifestyle modifications  See medications in patient instructions if given  Patient will follow up in 4 weeks 

## 2017-07-12 NOTE — Progress Notes (Signed)
Tawana ScaleZach Aniyah Nobis D.O. [facility] [address] [place], [place] [address] Phone: [phone] Subjective:    I'm seeing this patient by the request  of:    CC: Back pain follow-up  UUV:[id number]:Subjective  Suzanne Ross is a 27 y.o. female coming in with complaint of back pain.  Found to have more of a slipped rib syndrome.  Given exercises, home exercises that were work more for posture.  Discussed ergonomics.  Responded well to osteopathic manipulation.  Patient states that she was doing fine but that her ribs have shifted quite a bit recently. The pain has been strong enough where she cannot put it out of her head. Patient has been running again which is around the time that the ribs started to hurt. She has a hard time doing any core work.      Past Medical History:  Diagnosis Date  . Broken neck (HCC)   . Medical history non-contributory    Past Surgical History:  Procedure Laterality Date  . ANKLE FRACTURE SURGERY Left   . APPENDECTOMY    . RHINOPLASTY    . TONSILLECTOMY     Social History   Socioeconomic History  . Marital status: Married    Spouse name: Not on file  . Number of children: Not on file  . Years of education: Not on file  . Highest education level: Not on file  Occupational History  . Not on file  Social Needs  . Financial resource strain: Not on file  . Food insecurity:    Worry: Not on file    Inability: Not on file  . Transportation needs:    Medical: Not on file    Non-medical: Not on file  Tobacco Use  . Smoking status: Never Smoker  . Smokeless tobacco: Never Used  Substance and Sexual Activity  . Alcohol use: No    Alcohol/week: 0.0 oz  . Drug use: No  . Sexual activity: Yes    Partners: Male    Birth control/protection: None, Pill  Lifestyle  . Physical activity:    Days per week: Not on file    Minutes per session: Not on file  . Stress: Not on file  Relationships  . Social connections:    Talks on phone: Not on file    Gets  together: Not on file    Attends religious service: Not on file    Active member of club or organization: Not on file    Attends meetings of clubs or organizations: Not on file    Relationship status: Not on file  Other Topics Concern  . Not on file  Social History Narrative  . Not on file   No Known Allergies Family History  Problem Relation Age of Onset  . Hypertension Maternal Grandmother   . Diabetes Unknown        fob's-father     Past medical history, social, surgical and family history all reviewed in electronic medical record.  No pertanent information unless stated regarding to the chief complaint.   Review of Systems:Review of systems updated and as accurate as of 07/12/17  No headache, visual changes, nausea, vomiting, diarrhea, constipation, dizziness, abdominal pain, skin rash, fevers, chills, night sweats, weight loss, swollen lymph nodes, body aches, joint swelling,, chest pain, shortness of breath, mood changes.  Positive muscle aches  Objective  currently breastfeeding. Systems examined below as of 07/12/17   General: No apparent distress alert and oriented x3 mood and affect normal, dressed appropriately.  HEENT: Pupils equal, extraocular movements intact  Respiratory: Patient's speak in full sentences and does not appear short of breath  Cardiovascular: No lower extremity edema, non tender, no erythema  Skin: Warm dry intact with no signs of infection or rash on extremities or on axial skeleton.  Abdomen: Soft nontender  Neuro: Cranial nerves II through XII are intact, neurovascularly intact in all extremities with 2+ DTRs and 2+ pulses.  Lymph: No lymphadenopathy of posterior or anterior cervical chain or axillae bilaterally.  Gait normal with good balance and coordination.  MSK:  Non tender with full range of motion and good stability and symmetric strength and tone of shoulders, elbows, wrist, hip, knee and ankles bilaterally.  Back Exam:  Inspection:  Unremarkable  Motion: Flexion 45 deg, Extension 25 deg, Side Bending to 45 deg bilaterally,  Rotation to 45 deg bilaterally  SLR laying: Negative  XSLR laying: Negative  Palpable tenderness: Tender to palpation the paraspinal musculature lumbar spine right greater than left. FABER: negative. Sensory change: Gross sensation intact to all lumbar and sacral dermatomes.  Reflexes: 2+ at both patellar tendons, 2+ at achilles tendons, Babinski's downgoing.  Strength at foot  Plantar-flexion: 5/5 Dorsi-flexion: 5/5 Eversion: 5/5 Inversion: 5/5  Leg strength  Quad: 5/5 Hamstring: 5/5 Hip flexor: 5/5 Hip abductors: 5/5  Gait unremarkable.  Osteopathic findings C2 flexed rotated and side bent right T3 extended rotated and side bent right inhaled third rib T9 extended rotated and side bent left L2 flexed rotated and side bent right Sacrum right on right    Impression and Recommendations:     This case required medical decision making of moderate complexity.      Note: This dictation was prepared with Dragon dictation along with smaller phrase technology. Any transcriptional errors that result from this process are unintentional.

## 2017-07-12 NOTE — Patient Instructions (Signed)
Good to see you  One knee down one knee up tilt pelvis forward.  Hands overhead then rotate to upper leg.  Hold 10 seconds, relax, repeat and do other side as well.  Very important when after being in a flexed position for a long amount of time.  Do the stretch after running Ice is a good idea Stay hydrated  Eat within 45 minutes of working.  See me again in 4 weeks

## 2017-07-19 ENCOUNTER — Encounter: Payer: Self-pay | Admitting: Family Medicine

## 2017-07-19 ENCOUNTER — Ambulatory Visit: Payer: BLUE CROSS/BLUE SHIELD | Admitting: Family Medicine

## 2017-07-19 VITALS — BP 124/72 | HR 104 | Ht 65.0 in

## 2017-07-19 DIAGNOSIS — M999 Biomechanical lesion, unspecified: Secondary | ICD-10-CM | POA: Diagnosis not present

## 2017-07-19 DIAGNOSIS — M940 Chondrocostal junction syndrome [Tietze]: Secondary | ICD-10-CM | POA: Diagnosis not present

## 2017-07-19 MED ORDER — PREDNISONE 50 MG PO TABS
50.0000 mg | ORAL_TABLET | Freq: Every day | ORAL | 0 refills | Status: DC
Start: 2017-07-19 — End: 2018-04-12

## 2017-07-19 NOTE — Assessment & Plan Note (Signed)
Worsening symptoms again.  Discussed icing regimen and home exercises.  I believe that this was just a exacerbation.  Patient did have a rib out of place again though.  Continues to have difficulty imaging may be necessary.  Follow-up with me again 3-4 weeks

## 2017-07-19 NOTE — Assessment & Plan Note (Signed)
Decision today to treat with OMT was based on Physical Exam  After verbal consent patient was treated with HVLA, ME, FPR techniques in cervical, thoracic, rib, lumbar and sacral areas  Patient tolerated the procedure well with improvement in symptoms  Patient given exercises, stretches and lifestyle modifications  See medications in patient instructions if given  Patient will follow up in 3-4 weeks 

## 2017-07-19 NOTE — Patient Instructions (Signed)
Good to see you  Ice 20 minutes 2 times daily. Usually after activity and before bed. I hope this helps Try the prednisone daily for 5 days if not a lot better by a day or so.  See me again as scheduled.

## 2017-07-19 NOTE — Progress Notes (Signed)
[facility]awana ScaleZach Brenisha Tsui D.O. [facility] [address] [place], [place] [address] Phone: [phone] Subjective:    I'm seeing this patient by the request  of:    CC: Back pain follow-up  OZH:[id number]:Subjective  Suzanne Ross is a 27 y.o. female coming in with complaint of back pain. Patient has had to hold child quite bit as he is sick and she felt that her pain began to increase on Tuesday. She worked out on Wednesday and said that seemed to make things worse.  Patient is concerned and is having more pain.  States that it is severe and unfortunately causes her to have increasing discomfort.      Past Medical History:  Diagnosis Date  . Broken neck (HCC)   . Medical history non-contributory    Past Surgical History:  Procedure Laterality Date  . ANKLE FRACTURE SURGERY Left   . APPENDECTOMY    . RHINOPLASTY    . TONSILLECTOMY     Social History   Socioeconomic History  . Marital status: Married    Spouse name: Not on file  . Number of children: Not on file  . Years of education: Not on file  . Highest education level: Not on file  Occupational History  . Not on file  Social Needs  . Financial resource strain: Not on file  . Food insecurity:    Worry: Not on file    Inability: Not on file  . Transportation needs:    Medical: Not on file    Non-medical: Not on file  Tobacco Use  . Smoking status: Never Smoker  . Smokeless tobacco: Never Used  Substance and Sexual Activity  . Alcohol use: No    Alcohol/week: 0.0 oz  . Drug use: No  . Sexual activity: Yes    Partners: Male    Birth control/protection: None, Pill  Lifestyle  . Physical activity:    Days per week: Not on file    Minutes per session: Not on file  . Stress: Not on file  Relationships  . Social connections:    Talks on phone: Not on file    Gets together: Not on file    Attends religious service: Not on file    Active member of club or organization: Not on file    Attends meetings of clubs or  organizations: Not on file    Relationship status: Not on file  Other Topics Concern  . Not on file  Social History Narrative  . Not on file   No Known Allergies Family History  Problem Relation Age of Onset  . Hypertension Maternal Grandmother   . Diabetes Unknown        fob's-father     Past medical history, social, surgical and family history all reviewed in electronic medical record.  No pertanent information unless stated regarding to the chief complaint.   Review of Systems:Review of systems updated and as accurate as of 07/19/17  No headache, visual changes, nausea, vomiting, diarrhea, constipation, dizziness, abdominal pain, skin rash, fevers, chills, night sweats, weight loss, swollen lymph nodes, body aches, joint swelling, muscle aches, chest pain, shortness of breath, mood changes.   Objective  Blood pressure 124/72, pulse (!) 104, height 5\' 5"  (1.651 m), SpO2 98 %, currently breastfeeding. Systems examined below as of 07/19/17   General: No apparent distress alert and oriented x3 mood and affect normal, dressed appropriately.  HEENT: Pupils equal, extraocular movements intact  Respiratory: Patient's speak in full sentences and does  not appear short of breath  Cardiovascular: No lower extremity edema, non tender, no erythema  Skin: Warm dry intact with no signs of infection or rash on extremities or on axial skeleton.  Abdomen: Soft nontender  Neuro: Cranial nerves II through XII are intact, neurovascularly intact in all extremities with 2+ DTRs and 2+ pulses.  Lymph: No lymphadenopathy of posterior or anterior cervical chain or axillae bilaterally.  Gait normal with good balance and coordination.  MSK:  Non tender with full range of motion and good stability and symmetric strength and tone of shoulders, elbows, wrist, hip, knee and ankles bilaterally.  Back Exam:  Inspection: Mild loss of lordosis Motion: Flexion 45 deg, Extension 25 deg, Side Bending to 35 deg  bilaterally,  Rotation to 35 deg bilaterally  SLR laying: Negative  XSLR laying: Negative  Palpable tenderness: Mild tenderness to palpation more in the thoracolumbar juncture on the left side. FABER: negative. Sensory change: Gross sensation intact to all lumbar and sacral dermatomes.  Reflexes: 2+ at both patellar tendons, 2+ at achilles tendons, Babinski's downgoing.  Strength at foot  Plantar-flexion: 5/5 Dorsi-flexion: 5/5 Eversion: 5/5 Inversion: 5/5  Leg strength  Quad: 5/5 Hamstring: 5/5 Hip flexor: 5/5 Hip abductors: 5/5  Gait unremarkable.  Osteopathic findings C2 flexed rotated and side bent right C4 flexed rotated and side bent left C7 flexed rotated and side bent left T3 extended rotated and side bent right inhaled third rib T9 extended rotated and side bent left L3 flexed rotated and side bent right Sacrum right on right     Impression and Recommendations:     This case required medical decision making of moderate complexity.      Note: This dictation was prepared with Dragon dictation along with smaller phrase technology. Any transcriptional errors that result from this process are unintentional.        '

## 2017-08-06 NOTE — Progress Notes (Signed)
[facility] [address] [place], [place] [address] Phone: [phone] Subjective:     CC: Left hand pain  [id number]  Suzanne Ross is a 27 y.o. female coming in with complaint of left wrist pain. Slammed her hand in the door. Initially stayed swollen. Just went down yesterday. Has been wearing wrist brace.   Onset- Tuesday Location- ulnar side Character- Sharp, stabbing, ache Aggravating factors-  Therapies tried- elevation Severity-6 out of 10     Past Medical History:  Diagnosis Date  . Broken neck (HCC)   . Medical history non-contributory    Past Surgical History:  Procedure Laterality Date  . ANKLE FRACTURE SURGERY Left   . APPENDECTOMY    . RHINOPLASTY    . TONSILLECTOMY     Social History   Socioeconomic History  . Marital status: Married    Spouse name: Not on file  . Number of children: Not on file  . Years of education: Not on file  . Highest education level: Not on file  Occupational History  . Not on file  Social Needs  . Financial resource strain: Not on file  . Food insecurity:    Worry: Not on file    Inability: Not on file  . Transportation needs:    Medical: Not on file    Non-medical: Not on file  Tobacco Use  . Smoking status: Never Smoker  . Smokeless tobacco: Never Used  Substance and Sexual Activity  . Alcohol use: No    Alcohol/week: 0.0 oz  . Drug use: No  . Sexual activity: Yes    Partners: Male    Birth control/protection: None, Pill  Lifestyle  . Physical activity:    Days per week: Not on file    Minutes per session: Not on file  . Stress: Not on file  Relationships  . Social connections:    Talks on phone: Not on file    Gets together: Not on file    Attends religious service: Not on file    Active member of club or organization: Not on file    Attends meetings of clubs or organizations: Not on file    Relationship status: Not on file  Other Topics Concern  . Not on file    Social History Narrative  . Not on file   No Known Allergies Family History  Problem Relation Age of Onset  . Hypertension Maternal Grandmother   . Diabetes Unknown        fob's-father     Past medical history, social, surgical and family history all reviewed in electronic medical record.  No pertanent information unless stated regarding to the chief complaint.   Review of Systems:Review of systems updated and as accurate as of 08/06/17  No headache, visual changes, nausea, vomiting, diarrhea, constipation, dizziness, abdominal pain, skin rash, fevers, chills, night sweats, weight loss, swollen lymph nodes, body aches, joint swelling, muscle aches, chest pain, shortness of breath, mood changes.   Objective  currently breastfeeding. Systems examined below as of 08/06/17   General: No apparent distress alert and oriented x3 mood and affect normal, dressed appropriately.  HEENT: Pupils equal, extraocular movements intact  Respiratory: Patient's speak in full sentences and does not appear short of breath  Cardiovascular: No lower extremity edema, non tender, no erythema  Skin: Warm dry intact with no signs of infection or rash on extremities or on axial skeleton.  Abdomen: Soft nontender  Neuro: Cranial nerves II through XII  are intact, neurovascularly intact in all extremities with 2+ DTRs and 2+ pulses.  Lymph: No lymphadenopathy of posterior or anterior cervical chain or axillae bilaterally.  Gait normal with good balance and coordination.  MSK:  Non tender with full range of motion and good stability and symmetric strength and tone of shoulders, elbows, wrist, hip, knee and ankles bilaterally.   Left hand exam shows some mild bruising on the dorsal aspect of the hand mostly over the fifth metatarsal.  No significant bony abnormality noted.  Full range of motion of the hand noted.  Tender to palpation over the fourth fifth and third metatarsals.  Wrist pain fairly unremarkable with  good range of motion.  Limited musculoskeletal ultrasound was performed and interpreted by Judi SaaZachary M Henriette Hesser  Limited ultrasound of patient's hand shows the patient has some bone contusions over the dorsal aspect of the muscle of the hands.  No cortical defect noted.  Neurovascularly intact distally.  No neovascularization truly noted. Impression: Hand contusion     Impression and Recommendations:         Note: This dictation was prepared with Dragon dictation along with smaller phrase technology. Any transcriptional errors that result from this process are unintentional.

## 2017-08-07 ENCOUNTER — Encounter: Payer: Self-pay | Admitting: Family Medicine

## 2017-08-07 ENCOUNTER — Ambulatory Visit: Payer: Self-pay

## 2017-08-07 ENCOUNTER — Ambulatory Visit: Payer: BLUE CROSS/BLUE SHIELD | Admitting: Family Medicine

## 2017-08-07 VITALS — BP 114/68 | HR 89 | Ht 65.0 in | Wt 162.0 lb

## 2017-08-07 DIAGNOSIS — S6000XA Contusion of unspecified finger without damage to nail, initial encounter: Secondary | ICD-10-CM

## 2017-08-07 DIAGNOSIS — M25532 Pain in left wrist: Secondary | ICD-10-CM

## 2017-08-07 NOTE — Assessment & Plan Note (Signed)
In conclusion, discussed icing, home exercise, worsening symptoms to come back.  No x-rays needed with ultrasound.  Follow-up again 3 weeks

## 2017-08-07 NOTE — Patient Instructions (Signed)
Good to see you all!!! Arnica lotion 2 times a day  Ice 20 minutes 2 times daily. Usually after activity and before bed. No punching, should be gone in 2 weeks See me again in 4-6 weeks for the ribs

## 2017-09-09 NOTE — Progress Notes (Signed)
[facility] [address] [place], [place] [address] Phone: [phone] Subjective:      CC: Back pain and neck pain follow-up  NGE:[id number]  Suzanne Ross is a 27 y.o. female coming in with complaint of neck pain and neck pain follow-up.  Has had some tightness recently.  Has had slipped rib syndrome and scapular dysfunction.  Doing the exercises intermittently.  Will be moving in the next 3 weeks.  Patient states that this is considered him to have more discomfort and pain.      Past Medical History:  Diagnosis Date  . Broken neck (HCC)   . Medical history non-contributory    Past Surgical History:  Procedure Laterality Date  . ANKLE FRACTURE SURGERY Left   . APPENDECTOMY    . RHINOPLASTY    . TONSILLECTOMY     Social History   Socioeconomic History  . Marital status: Married    Spouse name: Not on file  . Number of children: Not on file  . Years of education: Not on file  . Highest education level: Not on file  Occupational History  . Not on file  Social Needs  . Financial resource strain: Not on file  . Food insecurity:    Worry: Not on file    Inability: Not on file  . Transportation needs:    Medical: Not on file    Non-medical: Not on file  Tobacco Use  . Smoking status: Never Smoker  . Smokeless tobacco: Never Used  Substance and Sexual Activity  . Alcohol use: No    Alcohol/week: 0.0 oz  . Drug use: No  . Sexual activity: Yes    Partners: Male    Birth control/protection: None, Pill  Lifestyle  . Physical activity:    Days per week: Not on file    Minutes per session: Not on file  . Stress: Not on file  Relationships  . Social connections:    Talks on phone: Not on file    Gets together: Not on file    Attends religious service: Not on file    Active member of club or organization: Not on file    Attends meetings of clubs or organizations: Not on file    Relationship status: Not on file  Other Topics  Concern  . Not on file  Social History Narrative  . Not on file   No Known Allergies Family History  Problem Relation Age of Onset  . Hypertension Maternal Grandmother   . Diabetes Unknown        fob's-father     Past medical history, social, surgical and family history all reviewed in electronic medical record.  No pertanent information unless stated regarding to the chief complaint.   Review of Systems:Review of systems updated and as accurate as of 09/10/17  No headache, visual changes, nausea, vomiting, diarrhea, constipation, dizziness, abdominal pain, skin rash, fevers, chills, night sweats, weight loss, swollen lymph nodes, body aches, joint swelling,  chest pain, shortness of breath, mood changes.  Positive muscle aches  Objective  Blood pressure 110/64, pulse 75, height  (1.651 m), weight 162 lb (73.5 kg), SpO2 98 %, currently breastfeeding. Systems examined below as of 09/10/17   General: No apparent distress alert and oriented x3 mood and affect normal, dressed appropriately.  HEENT: Pupils equal, extraocular movements intact  Respiratory: Patient's speak in full sentences and does not appear short of breath  Cardiovascular: No lower extremity edema, non tender,  no erythema  Skin: Warm dry intact with no signs of infection or rash on extremities or on axial skeleton.  Abdomen: Soft nontender  Neuro: Cranial nerves II through XII are intact, neurovascularly intact in all extremities with 2+ DTRs and 2+ pulses.  Lymph: No lymphadenopathy of posterior or anterior cervical chain or axillae bilaterally.  Gait normal with good balance and coordination.  MSK:  Non tender with full range of motion and good stability and symmetric strength and tone of shoulders, elbows, wrist, hip, knee and ankles bilaterally.  Back Exam:  Inspection: Unremarkable  Motion: Flexion 45 deg, Extension 25 deg, Side Bending to 35 deg bilaterally,  Rotation to 45 deg bilaterally  SLR laying:  Negative  XSLR laying: Negative  Palpable tenderness: Tightness in the thoracolumbar juncture. FABER: Tightness bilaterally. Sensory change: Gross sensation intact to all lumbar and sacral dermatomes.  Reflexes: 2+ at both patellar tendons, 2+ at achilles tendons, Babinski's downgoing.  Strength at foot  Plantar-flexion: 5/5 Dorsi-flexion: 5/5 Eversion: 5/5 Inversion: 5/5  Leg strength  Quad: 5/5 Hamstring: 5/5 Hip flexor: 5/5 Hip abductors: 5/5  Gait unremarkable. Mild scapular dyskinesis noted  Osteopathic findings C2 flexed rotated and side bent right C4 flexed rotated and side bent left C6 flexed rotated and side bent left T3 extended rotated and side bent right inhaled third rib T9 extended rotated and side bent left L2 flexed rotated and side bent right Sacrum left on left    Impression and Recommendations:     This case required medical decision making of moderate complexity.      Note: This dictation was prepared with Dragon dictation along with smaller phrase technology. Any transcriptional errors that result from this process are unintentional.

## 2017-09-10 ENCOUNTER — Ambulatory Visit: Payer: BLUE CROSS/BLUE SHIELD | Admitting: Family Medicine

## 2017-09-10 ENCOUNTER — Encounter: Payer: Self-pay | Admitting: Family Medicine

## 2017-09-10 VITALS — BP 110/64 | HR 75 | Ht 65.0 in | Wt 162.0 lb

## 2017-09-10 DIAGNOSIS — M940 Chondrocostal junction syndrome [Tietze]: Secondary | ICD-10-CM | POA: Diagnosis not present

## 2017-09-10 DIAGNOSIS — M999 Biomechanical lesion, unspecified: Secondary | ICD-10-CM

## 2017-09-10 NOTE — Assessment & Plan Note (Signed)
Decision today to treat with OMT was based on Physical Exam  After verbal consent patient was treated with HVLA, ME, FPR techniques in cervical, thoracic, rib, lumbar and sacral areas  Patient tolerated the procedure well with improvement in symptoms  Patient given exercises, stretches and lifestyle modifications  See medications in patient instructions if given  Patient will follow up in 6-12 weeks 

## 2017-09-10 NOTE — Patient Instructions (Signed)
Good to see you  Ice is your friend  Consider Acyclovir if you want See me again in 4-6 weeks

## 2017-09-10 NOTE — Assessment & Plan Note (Signed)
More stable than usual.  He does have some chronic problems at home.  Discussed icing regimen and home exercises.  Discussed which activities doing which wants to avoid.  Patient is to increase activity as tolerated.  Posture and ergonomics.  Follow-up again in 6 to 12 weeks

## 2017-09-23 ENCOUNTER — Encounter: Payer: Self-pay | Admitting: Obstetrics and Gynecology

## 2017-09-27 ENCOUNTER — Encounter: Payer: BLUE CROSS/BLUE SHIELD | Admitting: Obstetrics and Gynecology

## 2017-10-16 NOTE — Progress Notes (Signed)
[facility] [address] [place], [place] [address] Phone: [phone] Subjective:     CC: Neck and back pain follow-up  [id number]  Suzanne Ross is a 27 y.o. female coming in with complaint of neck and back pain follow-up.  Patient has been having some difficulty overall.  Some tightness.  Patient will be moving and has been packing recently and will be moving to [place].  Some increasing tightness.  Has a very active toddler.     Past Medical History:  Diagnosis Date  . Broken neck (HCC)   . Medical history non-contributory    Past Surgical History:  Procedure Laterality Date  . ANKLE FRACTURE SURGERY Left   . APPENDECTOMY    . RHINOPLASTY    . TONSILLECTOMY     Social History   Socioeconomic History  . Marital status: Married    Spouse name: Not on file  . Number of children: Not on file  . Years of education: Not on file  . Highest education level: Not on file  Occupational History  . Not on file  Social Needs  . Financial resource strain: Not on file  . Food insecurity:    Worry: Not on file    Inability: Not on file  . Transportation needs:    Medical: Not on file    Non-medical: Not on file  Tobacco Use  . Smoking status: Never Smoker  . Smokeless tobacco: Never Used  Substance and Sexual Activity  . Alcohol use: No    Alcohol/week: 0.0 oz  . Drug use: No  . Sexual activity: Yes    Partners: Male    Birth control/protection: None, Pill  Lifestyle  . Physical activity:    Days per week: Not on file    Minutes per session: Not on file  . Stress: Not on file  Relationships  . Social connections:    Talks on phone: Not on file    Gets together: Not on file    Attends religious service: Not on file    Active member of club or organization: Not on file    Attends meetings of clubs or organizations: Not on file    Relationship status: Not on file  Other Topics Concern  . Not on file  Social History Narrative  .  Not on file   No Known Allergies Family History  Problem Relation Age of Onset  . Hypertension Maternal Grandmother   . Diabetes Unknown        fob's-father     Past medical history, social, surgical and family history all reviewed in electronic medical record.  No pertanent information unless stated regarding to the chief complaint.   Review of Systems:Review of systems updated and as accurate as of 10/17/17  No headache, visual changes, nausea, vomiting, diarrhea, constipation, dizziness, abdominal pain, skin rash, fevers, chills, night sweats, weight loss, swollen lymph nodes, body aches, joint swelling,  chest pain, shortness of breath, mood changes.  Positive muscle aches  Objective  Blood pressure 110/70, pulse 81, height 5\' 5"  (1.651 m), weight 166 lb (75.3 kg), SpO2 98 %, currently breastfeeding. Systems examined below as of 10/17/17   General: No apparent distress alert and oriented x3 mood and affect normal, dressed appropriately.  HEENT: Pupils equal, extraocular movements intact  Respiratory: Patient's speak in full sentences and does not appear short of breath  Cardiovascular: No lower extremity edema, non tender, no erythema  Skin: Warm dry intact with no signs  of infection or rash on extremities or on axial skeleton.  Abdomen: Soft nontender  Neuro: Cranial nerves II through XII are intact, neurovascularly intact in all extremities with 2+ DTRs and 2+ pulses.  Lymph: No lymphadenopathy of posterior or anterior cervical chain or axillae bilaterally.  Gait normal with good balance and coordination.  MSK:  Non tender with full range of motion and good stability and symmetric strength and tone of shoulders, elbows, wrist, hip, knee and ankles bilaterally.    Neck: Inspection loss of lordosis. No palpable stepoffs. Negative Spurling's maneuver. Full neck range of motion Grip strength and sensation normal in bilateral hands Strength good C4 to T1 distribution No sensory  change to C4 to T1 Negative Hoffman sign bilaterally Reflexes normal Tightness of the trapezius bilaterally  Some tightness in the thoracolumbar junction bilaterally.  Osteopathic findings C3 flexed rotated and side bent left T3 extended rotated and side bent right inhaled third rib T7 extended rotated and side bent left L3 flexed rotated and side bent right Sacrum right on right       Impression and Recommendations:     This case required medical decision making of moderate complexity.      Note: This dictation was prepared with Dragon dictation along with smaller phrase technology. Any transcriptional errors that result from this process are unintentional.

## 2017-10-17 ENCOUNTER — Ambulatory Visit: Payer: BLUE CROSS/BLUE SHIELD | Admitting: Family Medicine

## 2017-10-17 ENCOUNTER — Encounter: Payer: Self-pay | Admitting: Family Medicine

## 2017-10-17 VITALS — BP 110/70 | HR 81 | Ht 65.0 in | Wt 166.0 lb

## 2017-10-17 DIAGNOSIS — M999 Biomechanical lesion, unspecified: Secondary | ICD-10-CM | POA: Diagnosis not present

## 2017-10-17 DIAGNOSIS — M940 Chondrocostal junction syndrome [Tietze]: Secondary | ICD-10-CM | POA: Diagnosis not present

## 2017-10-17 NOTE — Patient Instructions (Signed)
Good to see you  You are doing great overall  Hubby carries the heavy stuff See me again in 4-5 weeks

## 2017-10-17 NOTE — Assessment & Plan Note (Addendum)
.    We discussed continuing to work on the scapular dyskinesis.  Patient will continue with all the other regimen.  Not taking any medications at this time.  Follow-up again in 4 to 6 weeks before patient moves

## 2017-10-17 NOTE — Assessment & Plan Note (Signed)
Decision today to treat with OMT was based on Physical Exam  After verbal consent patient was treated with HVLA, ME, FPR techniques in cervical, thoracic, rib, lumbar and sacral areas  Patient tolerated the procedure well with improvement in symptoms  Patient given exercises, stretches and lifestyle modifications  See medications in patient instructions if given  Patient will follow up in 4-6 weeks 

## 2017-11-13 ENCOUNTER — Encounter

## 2017-11-13 ENCOUNTER — Ambulatory Visit: Payer: BLUE CROSS/BLUE SHIELD | Admitting: Family Medicine

## 2018-01-10 ENCOUNTER — Encounter: Payer: Self-pay | Admitting: Obstetrics and Gynecology

## 2018-01-10 ENCOUNTER — Encounter

## 2018-03-06 LAB — OB RESULTS CONSOLE HGB/HCT, BLOOD
HCT: 40 (ref 29–41)
Hemoglobin: 13.2

## 2018-03-06 LAB — HIV ANTIBODY (ROUTINE TESTING W REFLEX): HIV Screen 4th Generation wRfx: NONREACTIVE

## 2018-03-06 LAB — OB RESULTS CONSOLE ABO/RH: RH Type: POSITIVE

## 2018-03-06 LAB — OB RESULTS CONSOLE PLATELET COUNT: Platelets: 317

## 2018-03-06 LAB — OB RESULTS CONSOLE HEPATITIS B SURFACE ANTIGEN: Hepatitis B Surface Ag: NEGATIVE

## 2018-03-06 LAB — OB RESULTS CONSOLE RPR: RPR: NONREACTIVE

## 2018-03-07 LAB — OB RESULTS CONSOLE RUBELLA ANTIBODY, IGM: Rubella: IMMUNE

## 2018-03-07 LAB — OB RESULTS CONSOLE ANTIBODY SCREEN: Antibody Screen: NEGATIVE

## 2018-03-07 LAB — OB RESULTS CONSOLE GC/CHLAMYDIA
CHLAMYDIA, DNA PROBE: NEGATIVE
Gonorrhea: NEGATIVE

## 2018-04-12 ENCOUNTER — Ambulatory Visit (INDEPENDENT_AMBULATORY_CARE_PROVIDER_SITE_OTHER): Payer: Medicaid - Out of State | Admitting: Certified Nurse Midwife

## 2018-04-12 ENCOUNTER — Encounter: Payer: Self-pay | Admitting: Certified Nurse Midwife

## 2018-04-12 VITALS — BP 120/69 | HR 99 | Wt 168.0 lb

## 2018-04-12 DIAGNOSIS — F418 Other specified anxiety disorders: Secondary | ICD-10-CM

## 2018-04-12 DIAGNOSIS — Z3A20 20 weeks gestation of pregnancy: Secondary | ICD-10-CM

## 2018-04-12 DIAGNOSIS — O99342 Other mental disorders complicating pregnancy, second trimester: Secondary | ICD-10-CM

## 2018-04-12 DIAGNOSIS — O99340 Other mental disorders complicating pregnancy, unspecified trimester: Secondary | ICD-10-CM | POA: Insufficient documentation

## 2018-04-12 DIAGNOSIS — F32A Depression, unspecified: Secondary | ICD-10-CM | POA: Insufficient documentation

## 2018-04-12 DIAGNOSIS — F419 Anxiety disorder, unspecified: Secondary | ICD-10-CM | POA: Insufficient documentation

## 2018-04-12 DIAGNOSIS — Z3480 Encounter for supervision of other normal pregnancy, unspecified trimester: Secondary | ICD-10-CM | POA: Insufficient documentation

## 2018-04-12 DIAGNOSIS — F329 Major depressive disorder, single episode, unspecified: Secondary | ICD-10-CM | POA: Insufficient documentation

## 2018-04-12 NOTE — Patient Instructions (Signed)

## 2018-04-12 NOTE — Progress Notes (Signed)
TRANSFER IN OB HISTORY AND PHYSICAL  SUBJECTIVE:       Suzanne Ross is a 27 y.o. 732P1001 female, No LMP recorded. Patient is pregnant., Estimated Date of Delivery: None noted., Unknown, presents today for Transition of Prenatal Care. EPIC data migration from outside records is not completed today. Pt will sign release of medical records. She state she has completed blood work. She declined genetic screening.  Complaints today include: none      Gynecologic History No LMP recorded. Patient is pregnant. Normal Contraception: none Last Pap: 03/25/2015. Results were: normal  Obstetric History OB History  Gravida Para Term Preterm AB Living  2 1 1     1   SAB TAB Ectopic Multiple Live Births        0 1    # Outcome Date GA Lbr Len/2nd Weight Sex Delivery Anes PTL Lv  2 Current           1 Term 10/06/15 114w2d / 00:41 6 lb 10.5 oz (3.02 kg) M Vag-Spont None  LIV    Past Medical History:  Diagnosis Date  . Broken neck (HCC)   . Medical history non-contributory     Past Surgical History:  Procedure Laterality Date  . ANKLE FRACTURE SURGERY Left   . RHINOPLASTY    . TONSILLECTOMY      Current Outpatient Medications on File Prior to Visit  Medication Sig Dispense Refill  . Prenatal Vit-Fe Fumarate-FA (PRENATAL 1+1 PO) Prenatal     No current facility-administered medications on file prior to visit.     No Known Allergies  Social History   Socioeconomic History  . Marital status: Married    Spouse name: Not on file  . Number of children: Not on file  . Years of education: Not on file  . Highest education level: Not on file  Occupational History  . Not on file  Social Needs  . Financial resource strain: Not on file  . Food insecurity:    Worry: Not on file    Inability: Not on file  . Transportation needs:    Medical: Not on file    Non-medical: Not on file  Tobacco Use  . Smoking status: Never Smoker  . Smokeless tobacco: Never Used  Substance and Sexual  Activity  . Alcohol use: No    Alcohol/week: 0.0 standard drinks  . Drug use: No  . Sexual activity: Yes    Partners: Male    Birth control/protection: None  Lifestyle  . Physical activity:    Days per week: Not on file    Minutes per session: Not on file  . Stress: Not on file  Relationships  . Social connections:    Talks on phone: Not on file    Gets together: Not on file    Attends religious service: Not on file    Active member of club or organization: Not on file    Attends meetings of clubs or organizations: Not on file    Relationship status: Not on file  . Intimate partner violence:    Fear of current or ex partner: Not on file    Emotionally abused: Not on file    Physically abused: Not on file    Forced sexual activity: Not on file  Other Topics Concern  . Not on file  Social History Narrative  . Not on file    Family History  Problem Relation Age of Onset  . Hypertension Maternal Grandmother   . Diabetes Other  fob's-father    The following portions of the patient's history were reviewed and updated as appropriate: allergies, current medications, past OB history, past medical history, past surgical history, past family history, past social history, and problem list.    OBJECTIVE: Initial Physical Exam (New OB)  GENERAL APPEARANCE: alert, well appearing, in no apparent distress, oriented to person, place and time HEAD: normocephalic, atraumatic MOUTH: mucous membranes moist, pharynx normal without lesions THYROID: no thyromegaly or masses present BREASTS: no masses noted, no significant tenderness, no palpable axillary nodes, no skin changes LUNGS: clear to auscultation, no wheezes, rales or rhonchi, symmetric air entry HEART: regular rate and rhythm, no murmurs ABDOMEN: soft, nontender, nondistended, no abnormal masses, no epigastric pain EXTREMITIES: no redness or tenderness in the calves or thighs SKIN: normal coloration and turgor, no  rashes LYMPH NODES: no adenopathy palpable NEUROLOGIC: alert, oriented, normal speech, no focal findings or movement disorder noted  PELVIC EXAM not indicated, tested pelvis.   ASSESSMENT: Normal pregnancy   PLAN: New OB counseling: The patient has been given an overview regarding routine prenatal care. Recommendations regarding diet, weight gain, and exercise in pregnancy were given. Prenatal testing, optional genetic testing, and ultrasound use in pregnancy were reviewed. Benefits of Breast Feeding were discussed. The patient is encouraged to consider nursing her baby post partum. Pt states that looking back on her previous pregnancy she realizes that she had depression and anxiety, she felt distant and unattached during her pregnancy. She states when she got pregnant this time she had significant depression and had suicidal thoughts. She was going to counseling prior to moving back to [place]. She declines counseling at this time.Information ( fact sheet) given on zoloft in pregnancy. PHQ9 screen- 14 GAD score 16. Declines medication at this time. Pt instructed to notify if she has thoughts of hurting herself or her family. To seek urgent medical attention . She verbalizes and agrees to plan.

## 2018-04-24 NOTE — L&D Delivery Note (Signed)
     Delivery Note   Suzanne Ross is a 28 y.o. G2P1001 at [redacted]w[redacted]d Estimated Date of Delivery: 09/11/18  PRE-OPERATIVE DIAGNOSIS:  1) [redacted]w[redacted]d pregnancy.   POST-OPERATIVE DIAGNOSIS:  1) [redacted]w[redacted]d pregnancy s/p Vaginal, Spontaneous   Delivery Type: Vaginal, Spontaneous    Delivery Anesthesia: None   Labor Complications:   Nuchal cord around neck and arm , reduced    ESTIMATED BLOOD LOSS: 370  ml    FINDINGS:   1) female infant, Apgar scores of     at 1 minute and     at 5 minutes and a birthweight of    ounces.    2) Nuchal cord: Yes, around neck and arm, reduced   SPECIMENS:   PLACENTA:   Appearance: Intact , 3 vessel cord, cord blood sample collected   Removal: Spontaneous      Disposition:   Held per protocol then discarded.  DISPOSITION:  Infant to left in stable condition in the delivery room, with L&D personnel and mother,  NARRATIVE SUMMARY: Labor course:  Ms. Suzanne Ross is a G2P1001 at [redacted]w[redacted]d who presented for labor management.  She progressed well in labor without pitocin.  She  had no anesthesia and proceeded to complete dilation. She evidenced good maternal expulsive effort during the second stage. The head delivered LOT and rotated to OA . Nuchal cord around neck reduced. Delivery of shoulders with ease. Cord noted to be around arm as well. She went on to deliver a viable female  Infant " [name] Loralie". The placenta delivered without problems and was noted to be complete. A perineal and vaginal examination was performed. Episiotomy/Lacerations: 2nd degree . Laceration was repaired with 3-0 Vicryl Rapide suture using local anesthesia. The patient tolerated this well. Vaginal vault check completed x 2.   Doreene Burke, CNM  09/02/2018 5:12 AM

## 2018-04-26 ENCOUNTER — Telehealth: Payer: Self-pay | Admitting: Certified Nurse Midwife

## 2018-04-26 ENCOUNTER — Other Ambulatory Visit: Payer: Self-pay | Admitting: Certified Nurse Midwife

## 2018-04-26 ENCOUNTER — Telehealth: Payer: Self-pay

## 2018-04-26 MED ORDER — OSELTAMIVIR PHOSPHATE 75 MG PO CAPS: 75.0000 mg | ORAL_CAPSULE | Freq: Two times a day (BID) | ORAL | 0 refills | Status: DC

## 2018-04-26 NOTE — Telephone Encounter (Signed)
Suzanne Ross,   Can you let Kem know that I placed an order for Tamiflu to her pharmacy.   Thanks,  Doreene Burke, CNM

## 2018-04-26 NOTE — Telephone Encounter (Signed)
Patients husband called stating she has had the Flu and is not getting any better. He wants to know if there is anything we can do for her. Please Advise

## 2018-04-26 NOTE — Telephone Encounter (Signed)
Spoke with pt and let her know there was a script sent to her pharmacy per AT.

## 2018-04-26 NOTE — Progress Notes (Signed)
Order placed for tamiflu. Pt has flu exposure and is having symptoms.   Doreene Burke, CNM

## 2018-05-01 ENCOUNTER — Encounter: Payer: Self-pay | Admitting: Certified Nurse Midwife

## 2018-05-01 ENCOUNTER — Ambulatory Visit (INDEPENDENT_AMBULATORY_CARE_PROVIDER_SITE_OTHER): Payer: PRIVATE HEALTH INSURANCE | Admitting: Certified Nurse Midwife

## 2018-05-01 ENCOUNTER — Other Ambulatory Visit: Payer: Self-pay | Admitting: Certified Nurse Midwife

## 2018-05-01 ENCOUNTER — Ambulatory Visit (INDEPENDENT_AMBULATORY_CARE_PROVIDER_SITE_OTHER): Payer: PRIVATE HEALTH INSURANCE

## 2018-05-01 VITALS — BP 110/61 | HR 84 | Wt 168.2 lb

## 2018-05-01 DIAGNOSIS — Z363 Encounter for antenatal screening for malformations: Secondary | ICD-10-CM

## 2018-05-01 DIAGNOSIS — Z23 Encounter for immunization: Secondary | ICD-10-CM | POA: Diagnosis not present

## 2018-05-01 DIAGNOSIS — Z3A20 20 weeks gestation of pregnancy: Secondary | ICD-10-CM

## 2018-05-01 DIAGNOSIS — R200 Anesthesia of skin: Secondary | ICD-10-CM

## 2018-05-01 LAB — POCT URINALYSIS DIPSTICK OB
BILIRUBIN UA: NEGATIVE
Blood, UA: NEGATIVE
GLUCOSE, UA: NEGATIVE
Ketones, UA: NEGATIVE
LEUKOCYTES UA: NEGATIVE
Nitrite, UA: NEGATIVE
POC,PROTEIN,UA: NEGATIVE
Spec Grav, UA: <=1.005 — AB (ref 1.010–1.025)
Urobilinogen, UA: 0.2 E.U./dL
pH, UA: 5 (ref 5.0–8.0)

## 2018-05-01 NOTE — Patient Instructions (Signed)

## 2018-05-01 NOTE — Addendum Note (Signed)
Addended by: Mechele Claude on: 05/01/2018 02:00 PM   Modules accepted: Orders

## 2018-05-01 NOTE — Progress Notes (Addendum)
ROB doing well. Anatomy scan today incomplete ( see below) she will return 1 wk to complete. She feels good movement. GAD score today 9. Records from [facility] has not yet be received. PT signed another release of medical record form. Pt complains of numbness in legs. Reviewed back pain, sciatic pain and normal discomforts in pregnancy. Pt request referral to chiropractor. Suzanne Ross to call office to follow up. ROB 4 wks with [name].   Doreene Burke, CNM  Patient Name: Suzanne Ross DOB: [birth year] MRN: [id number]  ULTRASOUND REPORT  Location:[facility] Date of Service: 05/01/2018   Indications:Anatomy Ultrasound Findings:  Mason Jim intrauterine pregnancy is visualized with FHR at 144 BPM. Biometrics give an (U/S) Gestational age of [redacted]w[redacted]d and an (U/S) EDD of 09/16/18; this correlates with the clinically established Estimated Date of Delivery: None noted.  Fetal presentation is Breech.  EFW: wnl. Placenta: posterior. Grade: 0 AFI: subjectively normal.  Anatomic survey is incomplete for for the gender d/t fetal position and normal; Gender - not seen .    Right Ovary is normal in appearance. Left Ovary is normal appearance. Survey of the adnexa demonstrates no adnexal masses. There is no free peritoneal fluid in the cul de sac.  Impression: 1. Unknown Viable Singleton Intrauterine pregnancy by U/S. 2. (U/S) EDD is consistent with Clinically established Estimated Date of Delivery: None noted. . 3. Normal Anatomy Scan, incomplete for for the gender d/t fetal position 4. Cervical length = 32.79mm  Recommendations: 1.Clinical correlation with the patient's History and Physical Exam.  Abeer Alsammarraie,RDMS

## 2018-05-06 ENCOUNTER — Encounter: Payer: Self-pay | Admitting: Certified Nurse Midwife

## 2018-05-09 ENCOUNTER — Ambulatory Visit (INDEPENDENT_AMBULATORY_CARE_PROVIDER_SITE_OTHER): Payer: PRIVATE HEALTH INSURANCE

## 2018-05-09 DIAGNOSIS — Z362 Encounter for other antenatal screening follow-up: Secondary | ICD-10-CM | POA: Diagnosis not present

## 2018-05-09 DIAGNOSIS — Z3A20 20 weeks gestation of pregnancy: Secondary | ICD-10-CM

## 2018-05-21 ENCOUNTER — Telehealth: Payer: Self-pay | Admitting: Certified Nurse Midwife

## 2018-05-21 ENCOUNTER — Encounter: Payer: Self-pay | Admitting: Family Medicine

## 2018-05-21 NOTE — Telephone Encounter (Signed)
The patient called and stated that she is experiencing severe lower abdominal cramping on the left side and is very concerned. The patient would like to speak with her nurse or provider as soon as possible if possible. Please advise.

## 2018-05-23 ENCOUNTER — Telehealth: Payer: Self-pay | Admitting: Obstetrics and Gynecology

## 2018-05-23 ENCOUNTER — Observation Stay
Admission: EM | Admit: 2018-05-23 | Discharge: 2018-05-23 | Disposition: A | Discharge status: Home / Self Care | Payer: PRIVATE HEALTH INSURANCE | Attending: Certified Nurse Midwife | Admitting: Certified Nurse Midwife

## 2018-05-23 ENCOUNTER — Emergency Department
Admission: EM | Admit: 2018-05-23 | Discharge: 2018-05-23 | Disposition: A | Discharge status: Home / Self Care | Payer: PRIVATE HEALTH INSURANCE | Source: Home / Self Care | Attending: Emergency Medicine | Admitting: Emergency Medicine

## 2018-05-23 ENCOUNTER — Other Ambulatory Visit: Payer: Self-pay

## 2018-05-23 ENCOUNTER — Encounter: Payer: Self-pay | Admitting: Intensive Care

## 2018-05-23 ENCOUNTER — Emergency Department: Payer: PRIVATE HEALTH INSURANCE

## 2018-05-23 DIAGNOSIS — S39012A Strain of muscle, fascia and tendon of lower back, initial encounter: Secondary | ICD-10-CM | POA: Insufficient documentation

## 2018-05-23 DIAGNOSIS — Y92010 Kitchen of single-family (private) house as the place of occurrence of the external cause: Secondary | ICD-10-CM | POA: Insufficient documentation

## 2018-05-23 DIAGNOSIS — Z79899 Other long term (current) drug therapy: Secondary | ICD-10-CM | POA: Insufficient documentation

## 2018-05-23 DIAGNOSIS — Z3A24 24 weeks gestation of pregnancy: Secondary | ICD-10-CM

## 2018-05-23 DIAGNOSIS — S7002XA Contusion of left hip, initial encounter: Secondary | ICD-10-CM | POA: Insufficient documentation

## 2018-05-23 DIAGNOSIS — Y998 Other external cause status: Secondary | ICD-10-CM

## 2018-05-23 DIAGNOSIS — Y93G1 Activity, food preparation and clean up: Secondary | ICD-10-CM

## 2018-05-23 DIAGNOSIS — O9A212 Injury, poisoning and certain other consequences of external causes complicating pregnancy, second trimester: Secondary | ICD-10-CM

## 2018-05-23 DIAGNOSIS — O26892 Other specified pregnancy related conditions, second trimester: Principal | ICD-10-CM | POA: Insufficient documentation

## 2018-05-23 DIAGNOSIS — W19XXXA Unspecified fall, initial encounter: Secondary | ICD-10-CM | POA: Diagnosis not present

## 2018-05-23 DIAGNOSIS — W010XXA Fall on same level from slipping, tripping and stumbling without subsequent striking against object, initial encounter: Secondary | ICD-10-CM | POA: Insufficient documentation

## 2018-05-23 DIAGNOSIS — M25552 Pain in left hip: Secondary | ICD-10-CM

## 2018-05-23 NOTE — ED Notes (Signed)
Pt states she slipped this morning at 9am today. Pt c/o lower abd pain and left hip and buttocks 8/10. While in waiting area pt felt a contraction on lower abd  that 20 seconds. Pt is A/Ox4 . No LOC.

## 2018-05-23 NOTE — ED Notes (Signed)
Dr. Quale at bedside.  

## 2018-05-23 NOTE — Discharge Instructions (Addendum)
You are being discharged from the emergency room and we will assist you to be evaluated tonight by our OB/GYN team at our labor and delivery unit.

## 2018-05-23 NOTE — ED Notes (Signed)
Pt denies any vaginal bleeding.

## 2018-05-23 NOTE — ED Notes (Signed)
Pt ambulatory to toilet

## 2018-05-23 NOTE — OB Triage Note (Signed)
   L&D OB Triage Note  SUBJECTIVE Suzanne Ross is a 28 y.o. G8P1001 female at [redacted]w[redacted]d, EDD Estimated Date of Delivery: 09/11/18 who presented to triage with complaints of falling on her left hip at approximately 0900 this morning. She was seen in the ED and has been cleared. She denies loss of fluid, vaginal bleeding , and has had 1-2 contractions. She feels good fetal movement.    OB History  Gravida Para Term Preterm AB Living  2 1 1  0 0 1  SAB TAB Ectopic Multiple Live Births  0 0 0 0 1    # Outcome Date GA Lbr Len/2nd Weight Sex Delivery Anes PTL Lv  2 Current           1 Term 10/06/15 [redacted]w[redacted]d / 00:41 3020 g M Vag-Spont None  LIV     Name: Hegstrom,BOY Curry     Apgar1: 8  Apgar5: 9    Medications Prior to Admission  Medication Sig Dispense Refill Last Dose  . acetaminophen (TYLENOL) 325 MG tablet Take 650 mg by mouth every 6 (six) hours as needed.   05/23/2018 at 0930  . Prenatal Vit-Fe Fumarate-FA (PRENATAL 1+1 PO) Take 1 tablet by mouth daily.    05/22/2018 at 0800     OBJECTIVE  Nursing Evaluation:   BP (!) 111/59 (BP Location: Right Arm)   Pulse 86   Temp 97.9 F (36.6 C) (Oral)   Resp 17   Ht 5\' 5"  (1.651 m)   Wt 76.2 kg   BMI 27.96 kg/m    Findings:  No signs of abruption  NST was performed and has been reviewed by me.  NST INTERPRETATION: Category I, appropriate for gestational age  Mode: External Baseline Rate (A): 140 bpm Variability: Moderate Accelerations: 10 x 10 Decelerations: None     Contraction Frequency (min): none  ASSESSMENT Impression:  1.  Pregnancy:  G2P1001 at [redacted]w[redacted]d , EDD Estimated Date of Delivery: 09/11/18 2.  NST:  Category I  PLAN 1. Reassurance given, reviewed signs and symptoms of abruption 2. Discharge home with standard labor precautions given to return to L&D or call the office for problems. 3. Continue routine prenatal care.    Doreene Burke, CNM

## 2018-05-23 NOTE — Telephone Encounter (Signed)
The patient's husband called and stated the patient fell in the kitchen about 10 mins prior to the call, and stated the patient was hurting in her hip, back and had no bleeding, leaking of fluid.  The nurse advised the patient thru her husband to make sure to rest and verify fetal movement today.  The patient did not fall on her belly.  No further action required at this time.

## 2018-05-23 NOTE — ED Triage Notes (Signed)
Patient reports less than an hour ago falling onto tile floor hitting Left side and Left elbow. C/o sharp left sided shooting pains in side and some lower left sided pelvic pain. Denies any bleeding at this time.

## 2018-05-23 NOTE — ED Triage Notes (Signed)
[redacted] week pregnant. Slipped and fell this am and now has pain left hip and left abdomen.  Says baby has not moved as much as usual.  Has not checked for any vaginal bleeding.

## 2018-05-23 NOTE — ED Notes (Signed)
Pt returned from xray via stretcher.

## 2018-05-23 NOTE — ED Provider Notes (Signed)
[facility] Emergency Department Provider Note   ____________________________________________   First MD Initiated Contact with Patient 05/23/18 1339     (approximate)  I have reviewed the triage vital signs and the nursing notes.   HISTORY  Chief Complaint Abdominal Pain; Fall; and Hip Pain    HPI Suzanne Ross is a 28 y.o. female who was in her kitchen, she was getting eggs out of her refrigerator when she slipped on the floor on her pajamas and fell slamming fairly hard over her left hip and pelvic region.  She reports the pain is located primarily along the left side of the hip, a little bit of discomfort to the left of her lower spine, pain is worsened with movement especially trying to sit up twist and bend.  She is able to bear weight on her legs, reports the pain about an 8 out of 10.  Took Tylenol which is helped some.  Reports she does not want to take anything stronger than Tylenol for pain at this time.  No loss of fluid or vaginal bleeding.  Had one episode about an hour ago where she felt as though she had a "contraction" that lasted a few minutes and is since gone away with no further.  She is feeling her baby move.  No head injury.  Did not lose consciousness.  No chest pain or trouble breathing.  No neck pain.  But at first she was not feeling the baby move as much as usual, but now baby is very active. Past Medical History:  Diagnosis Date  . Broken neck (HCC)   . Medical history non-contributory     Patient Active Problem List   Diagnosis Date Noted  . Anxiety 04/12/2018  . Depression affecting pregnancy 04/12/2018  . Supervision of other normal pregnancy, antepartum 04/12/2018  . Contusion of finger of left hand, initial encounter 08/07/2017  . Nonallopathic lesion of cervical region 05/31/2017  . Nonallopathic lesion of lumbar region 05/31/2017  . Slipped rib syndrome 12/14/2015  . Nonallopathic lesion of thoracic region 12/14/2015   . Nonallopathic lesion of rib cage 12/14/2015    Past Surgical History:  Procedure Laterality Date  . ANKLE FRACTURE SURGERY Left   . RHINOPLASTY    . TONSILLECTOMY    . WISDOM TOOTH EXTRACTION      Prior to Admission medications   Medication Sig Start Date End Date Taking? Authorizing Provider  acetaminophen (TYLENOL) 325 MG tablet Take 650 mg by mouth every 6 (six) hours as needed.   Yes [provider]  Prenatal Vit-Fe Fumarate-FA (PRENATAL 1+1 PO) Take 1 tablet by mouth daily.    Yes [provider]    Allergies Patient has no known allergies.  Family History  Problem Relation Age of Onset  . Hypertension Maternal Grandmother   . Diabetes Other        fob's-father    Social History Social History   Tobacco Use  . Smoking status: Never Smoker  . Smokeless tobacco: Never Used  Substance Use Topics  . Alcohol use: No    Alcohol/week: 0.0 standard drinks  . Drug use: No    Review of Systems Constitutional: No fever/chills. Eyes: No visual changes. ENT: No sore throat. Cardiovascular: Denies chest pain. Respiratory: Denies shortness of breath. Gastrointestinal: No abdominal pain.   Genitourinary: Negative for dysuria. Musculoskeletal: No thoracic or rib pain. Skin: Negative for rash. Neurological: Negative for headaches, areas of focal weakness or numbness.    ____________________________________________  PHYSICAL EXAM:  VITAL SIGNS: ED Triage Vitals  Enc Vitals Group     BP 05/23/18 0958 122/63     Pulse Rate 05/23/18 0958 89     Resp 05/23/18 0958 15     Temp 05/23/18 0958 98 F (36.7 C)     Temp Source 05/23/18 0958 Oral     SpO2 05/23/18 0958 100 %     Weight 05/23/18 0958 168 lb (76.2 kg)     Height 05/23/18 0958 5\' 5"  (1.651 m)     Head Circumference --      Peak Flow --      Pain Score 05/23/18 1009 8     Pain Loc --      Pain Edu? --      Excl. in GC? --     Constitutional: Alert and oriented. Well appearing and  in no acute distress.  She is however laying on her right side reporting the pressure over the left buttock and left hip region produces discomfort and soreness. Eyes: Conjunctivae are normal. Head: Atraumatic. Nose: No congestion/rhinnorhea. Mouth/Throat: Mucous membranes are moist. Neck: No stridor.  Cardiovascular: Normal rate, regular rhythm. Grossly normal heart sounds.  Good peripheral circulation. Respiratory: Normal respiratory effort.  No retractions. Lungs CTAB. Gastrointestinal: Soft and nontender. No distention. Musculoskeletal: No lower extremity tenderness nor edema except does report tenderness to palpation of the lateral left buttock and left lumbosacral region but no midline lumbar thoracic tenderness.  Is able to range both lower extremities well but some discomfort especially over the left lateral hip joint and buttock region during motion.. Bedside ultrasound demonstrates fetal heart tones 148 with good fetal movement noted on my bedside ultrasound Neurologic:  Normal speech and language. No gross focal neurologic deficits are appreciated.  Skin:  Skin is warm, dry and intact. No rash noted. Psychiatric: Mood and affect are normal. Speech and behavior are normal.  ____________________________________________   LABS (all labs ordered are listed, but only abnormal results are displayed)  Labs Reviewed - No data to display ____________________________________________  EKG   ____________________________________________  RADIOLOGY  Dg Hip Unilat With Pelvis 1v Left  Result Date: 05/23/2018 CLINICAL DATA:  Left hip and buttocks pain. Twenty-four week pregnant patient. EXAM: DG HIP (WITH OR WITHOUT PELVIS) 1V*L* COMPARISON:  None. FINDINGS: Only a single AP view was obtained period on this view, no evidence of fracture or dislocation. IMPRESSION: Only a single AP view was obtained of the left hip. On this view, no fracture or dislocation noted. Electronically Signed   By:  Gerome Samavid  Williams III M.D   On: 05/23/2018 15:42    ____________________________________________   PROCEDURES  Procedure(s) performed: None  Procedures  Critical Care performed: No  ____________________________________________   INITIAL IMPRESSION / ASSESSMENT AND PLAN / ED COURSE  Pertinent labs & imaging results that were available during my care of the patient were reviewed by me and considered in my medical decision making (see chart for details).   Patient presents after a fall.  Fetal heart tones are normal.  Reassuring clinical examination except I suspect likely musculoskeletal injury, very low risk for acute intra-abdominal injury.  Did have a couple 1 or 2 contractions, but no evidence of an acute abruption at this time are noted.  Clinical Course as of May 23 1601  Thu May 23, 2018  1409 HR 148 on bedside evaluation of FHTs   [MQ]    Clinical Course User Index [MQ] Sharyn CreamerQuale, Jessilynn Taft, MD   Discussed with  the patient imaging, choice of imaging, studies, x-rays of the lower back, pelvis, left hip, and given the patient's concern around risk of radiation to the fetus we will obtain a single view of the left hip, and the patient will monitor her symptoms if they are not improving or worsening she will return for additional imaging.  Things is agreeable, the patient seems very competent with regard to return precautions and her personal decisions to avoid radiation even though this would be very low risk to the fetus overall.  Patient does not wish for anything stronger than Tylenol though I did offer stronger pain medication.  After imaging, left hip and lower pelvis did not show evidence of acute fracture.  Discussed with patient and she is comfortable with plan, discussed with Dr. Valentino Saxon of gynecology and patient will be seen and evaluated in labor and delivery for further monitoring  ____________________________________________   FINAL CLINICAL IMPRESSION(S) / ED  DIAGNOSES  Final diagnoses:  Contusion of left hip, initial encounter  Lumbar strain, initial encounter        Note:  This document was prepared using Dragon voice recognition software and may include unintentional dictation errors       Sharyn Creamer, MD 05/23/18 2121

## 2018-05-23 NOTE — OB Triage Note (Signed)
Patient presented to L&D after visit to the ED for fall this morning at 0900. Patient slipped on the tile in the kitchen and fell on her left hip and lower left back.  Xrays done in ED no breaks noted. Patient denies vaginal bleeding, leaking fluid or decreased fetal movement. Patient states she felt a couple contractions while in the ED but isn't feeling anything regularly. Denies any problems currently with this pregnancy or previous pregnancy.

## 2018-05-23 NOTE — ED Notes (Signed)
MD at bedside. Husband stepped out to grab food

## 2018-05-29 ENCOUNTER — Ambulatory Visit (INDEPENDENT_AMBULATORY_CARE_PROVIDER_SITE_OTHER): Payer: PRIVATE HEALTH INSURANCE | Admitting: Family Medicine

## 2018-05-29 ENCOUNTER — Encounter: Payer: Self-pay | Admitting: Family Medicine

## 2018-05-29 VITALS — BP 102/62 | HR 88 | Ht 65.0 in | Wt 172.0 lb

## 2018-05-29 DIAGNOSIS — S338XXA Sprain of other parts of lumbar spine and pelvis, initial encounter: Secondary | ICD-10-CM | POA: Diagnosis not present

## 2018-05-29 DIAGNOSIS — M999 Biomechanical lesion, unspecified: Secondary | ICD-10-CM

## 2018-05-29 NOTE — Patient Instructions (Signed)
Good to see you  Illiolumbar ligament  Arnica lotion can help maybe with bruising  Ice 20 minutes 2 times daily. Usually after activity and before bed. Exercises 3 times a week.  Do what feels good.  See me again in 3-4 weeks

## 2018-05-29 NOTE — Progress Notes (Signed)
[facility] [address] [place], [place] [address] Phone: [phone] Subjective:   Bruce Donath, am serving as a scribe for Dr. Antoine Primas.   CC: Back and hip pain  OAC:[id number]  Suzanne Ross is a 28 y.o. female coming in with complaint of back and hip pain. Last seen on 10/17/2017. Fell on 05/23/2018. Had xray taken. Pain over the posterior left hip.  Soreness into the back of her left leg and into the thoracic spine on the left side of spine. Otherwise has had some issues since we last saw her from moving to [place] and then back to her parents.  Patient is pregnant.  Went to the emergency room overall has been doing relatively well.  Does feel the baby move.  Still has some discomfort and pain on the left side.       Past Medical History:  Diagnosis Date  . Broken neck (HCC)   . Medical history non-contributory    Past Surgical History:  Procedure Laterality Date  . ANKLE FRACTURE SURGERY Left   . RHINOPLASTY    . TONSILLECTOMY    . WISDOM TOOTH EXTRACTION     Social History   Socioeconomic History  . Marital status: Married    Spouse name: Not on file  . Number of children: Not on file  . Years of education: Not on file  . Highest education level: Not on file  Occupational History  . Not on file  Social Needs  . Financial resource strain: Not on file  . Food insecurity:    Worry: Not on file    Inability: Not on file  . Transportation needs:    Medical: Not on file    Non-medical: Not on file  Tobacco Use  . Smoking status: Never Smoker  . Smokeless tobacco: Never Used  Substance and Sexual Activity  . Alcohol use: No    Alcohol/week: 0.0 standard drinks  . Drug use: No  . Sexual activity: Yes    Partners: Male    Birth control/protection: None  Lifestyle  . Physical activity:    Days per week: Not on file    Minutes per session: Not on file  . Stress: Not on file  Relationships  . Social connections:   Talks on phone: Not on file    Gets together: Not on file    Attends religious service: Not on file    Active member of club or organization: Not on file    Attends meetings of clubs or organizations: Not on file    Relationship status: Not on file  Other Topics Concern  . Not on file  Social History Narrative  . Not on file   No Known Allergies Family History  Problem Relation Age of Onset  . Hypertension Maternal Grandmother   . Diabetes Other        fob's-father       Current Outpatient Medications (Analgesics):  .  acetaminophen (TYLENOL) 325 MG tablet, Take 650 mg by mouth every 6 (six) hours as needed.   Current Outpatient Medications (Other):  Marland Kitchen  Prenatal Vit-Fe Fumarate-FA (PRENATAL 1+1 PO), Take 1 tablet by mouth daily.     Past medical history, social, surgical and family history all reviewed in electronic medical record.  No pertanent information unless stated regarding to the chief complaint.   Review of Systems:  No headache, visual changes, nausea, vomiting, diarrhea, constipation, dizziness, abdominal pain, skin rash, fevers, chills, night sweats,  weight loss, swollen lymph nodes, body aches, joint swelling,  chest pain, shortness of breath, mood changes.  Positive muscle aches  Objective  Blood pressure 102/62, pulse 88, height 5\' 5"  (1.651 m), weight 172 lb (78 kg), SpO2 99 %, currently breastfeeding.   General: No apparent distress alert and oriented x3 mood and affect normal, dressed appropriately.  HEENT: Pupils equal, extraocular movements intact  Respiratory: Patient's speak in full sentences and does not appear short of breath  Cardiovascular: No lower extremity edema, non tender, no erythema  Skin: Warm dry intact with no signs of infection or rash on extremities or on axial skeleton.  Abdomen: Soft, gravid  Neuro: Cranial nerves II through XII are intact, neurovascularly intact in all extremities with 2+ DTRs and 2+ pulses.  Lymph: No  lymphadenopathy of posterior or anterior cervical chain or axillae bilaterally.  Gait antalgic  MSK:  tender with limited range of motion and good stability and symmetric strength and tone of shoulders, elbows, wrist, hip, knee and ankles bilaterally.  Back exam: Shows some increase in lordosis.  Patient has tenderness to palpation of the paraspinal musculature of the lumbar spine on the left side with worsening pain with extension.  Seems to be in the iliolumbar ligaments.  Patient has near full flexion but patient's gravid belly does not perform night.  Mild tightness of Faber test on the left side.  Negative straight leg test.  Osteopathic findings  C7 flexed rotated and side bent left T3 extended rotated and side bent right inhaled third rib T7 extended rotated and side bent left L2 flexed rotated and side bent right Sacrum left on left      Impression and Recommendations:     This case required medical decision making of moderate complexity. The above documentation has been reviewed and is accurate and complete Antoine PrimasZachary Kamden Reber, DO.       Note: This dictation was prepared with Dragon dictation along with smaller phrase technology. Any transcriptional errors that result from this process are unintentional.

## 2018-05-29 NOTE — Assessment & Plan Note (Signed)
Decision today to treat with OMT was based on Physical Exam  After verbal consent patient was treated with HVLA, ME, FPR techniques in cervical, thoracic, rib, lumbar and sacral areas  Patient tolerated the procedure well with improvement in symptoms  Patient given exercises, stretches and lifestyle modifications  See medications in patient instructions if given  Patient will follow up in 4 weeks 

## 2018-05-29 NOTE — Assessment & Plan Note (Signed)
Patient does have iliolumbar pain.  Likely of injury at this point.  Patient's prolactin levels are increasing.  Patient did have more of this exacerbation secondary to the gravid belly at the moment.  Patient is going to start to increase range of motion exercises.  Likely this will improve but not until after patient has delivered the baby due to what was said previously.  Discussed icing regimen and home exercise.  Follow-up again 4 weeks

## 2018-05-30 ENCOUNTER — Encounter: Payer: Self-pay | Admitting: Family Medicine

## 2018-06-05 ENCOUNTER — Ambulatory Visit (INDEPENDENT_AMBULATORY_CARE_PROVIDER_SITE_OTHER): Payer: PRIVATE HEALTH INSURANCE | Admitting: Obstetrics and Gynecology

## 2018-06-05 VITALS — BP 110/62 | HR 89 | Wt 176.6 lb

## 2018-06-05 DIAGNOSIS — Z3492 Encounter for supervision of normal pregnancy, unspecified, second trimester: Secondary | ICD-10-CM | POA: Diagnosis not present

## 2018-06-05 LAB — POCT URINALYSIS DIPSTICK OB
Bilirubin, UA: NEGATIVE
Blood, UA: NEGATIVE
GLUCOSE, UA: NEGATIVE
Ketones, UA: NEGATIVE
Leukocytes, UA: NEGATIVE
Nitrite, UA: NEGATIVE
POC,PROTEIN,UA: NEGATIVE
Spec Grav, UA: 1.01 (ref 1.010–1.025)
Urobilinogen, UA: 0.2 E.U./dL
pH, UA: 7.5 (ref 5.0–8.0)

## 2018-06-05 NOTE — Progress Notes (Signed)
ROB- has been seeing Dr. Antoine Primas, L hip pain

## 2018-06-05 NOTE — Progress Notes (Signed)
ROB- doing well, glucola next visit.still nauseated daily-will try bonjesta. V2 combo supporter for left hip pain after fall last week.

## 2018-06-18 ENCOUNTER — Ambulatory Visit (INDEPENDENT_AMBULATORY_CARE_PROVIDER_SITE_OTHER): Payer: PRIVATE HEALTH INSURANCE | Admitting: Certified Nurse Midwife

## 2018-06-18 ENCOUNTER — Other Ambulatory Visit: Payer: PRIVATE HEALTH INSURANCE

## 2018-06-18 VITALS — BP 69/33 | HR 109 | Wt 174.5 lb

## 2018-06-18 DIAGNOSIS — Z131 Encounter for screening for diabetes mellitus: Secondary | ICD-10-CM

## 2018-06-18 DIAGNOSIS — Z130 Encounter for screening for diseases of the blood and blood-forming organs and certain disorders involving the immune mechanism: Secondary | ICD-10-CM

## 2018-06-18 DIAGNOSIS — Z3492 Encounter for supervision of normal pregnancy, unspecified, second trimester: Secondary | ICD-10-CM

## 2018-06-18 LAB — POCT URINALYSIS DIPSTICK OB
BILIRUBIN UA: NEGATIVE
Blood, UA: NEGATIVE
Glucose, UA: NEGATIVE
Ketones, UA: NEGATIVE
Leukocytes, UA: NEGATIVE
Nitrite, UA: NEGATIVE
PH UA: 5 (ref 5.0–8.0)
POC,PROTEIN,UA: NEGATIVE
Spec Grav, UA: 1.02 (ref 1.010–1.025)
UROBILINOGEN UA: 0.2 U/dL

## 2018-06-18 MED ORDER — TETANUS-DIPHTH-ACELL PERTUSSIS 5-2.5-18.5 LF-MCG/0.5 IM SUSP
0.5000 mL | Freq: Once | INTRAMUSCULAR | Status: AC
  Administered 2018-06-18: 0.5 mL via INTRAMUSCULAR

## 2018-06-18 NOTE — Progress Notes (Signed)
ROB-Reports increased pelvic pressure and vulvar swelling. Discussed home treatment measures including use of abdominal support. 28 week labs today including varicella. TDaP given. Blood transfusion consent reviewed and signed. Third trimester handouts given. Anticipatory guidance regarding course of prenatal care. Reviewed red flag symptoms and when to call. RTC x 2 weeks for ROB or sooner if needed.

## 2018-06-18 NOTE — Patient Instructions (Signed)
WHAT OB PATIENTS CAN EXPECT   Confirmation of pregnancy and ultrasound ordered if medically indicated-[redacted] weeks gestation  New OB (NOB) intake with nurse and New OB (NOB) labs- [redacted] weeks gestation  New OB (NOB) physical examination with provider- 11/[redacted] weeks gestation  Flu vaccine-[redacted] weeks gestation  Anatomy scan-[redacted] weeks gestation  Glucose tolerance test, blood work to test for anemia, T-dap vaccine-[redacted] weeks gestation  Vaginal swabs/cultures-STD/Group B strep-[redacted] weeks gestation  Appointments every 4 weeks until 28 weeks  Every 2 weeks from 28 weeks until 36 weeks  Weekly visits from 36 weeks until delivery  Third Trimester of Pregnancy  The third trimester is from week 28 through week 40 (months 7 through 9). This trimester is when your unborn baby (fetus) is growing very fast. At the end of the ninth month, the unborn baby is about 20 inches in length. It weighs about 6-10 pounds. Follow these instructions at home: Medicines  Take over-the-counter and prescription medicines only as told by your doctor. Some medicines are safe and some medicines are not safe during pregnancy.  Take a prenatal vitamin that contains at least 600 micrograms (mcg) of folic acid.  If you have trouble pooping (constipation), take medicine that will make your stool soft (stool softener) if your doctor approves. Eating and drinking   Eat regular, healthy meals.  Avoid raw meat and uncooked cheese.  If you get low calcium from the food you eat, talk to your doctor about taking a daily calcium supplement.  Eat four or five small meals rather than three large meals a day.  Avoid foods that are high in fat and sugars, such as fried and sweet foods.  To prevent constipation: ? Eat foods that are high in fiber, like fresh fruits and vegetables, whole grains, and beans. ? Drink enough fluids to keep your pee (urine) clear or pale yellow. Activity  Exercise only as told by your doctor. Stop  exercising if you start to have cramps.  Avoid heavy lifting, wear low heels, and sit up straight.  Do not exercise if it is too hot, too humid, or if you are in a place of great height (high altitude).  You may continue to have sex unless your doctor tells you not to. Relieving pain and discomfort  Wear a good support bra if your breasts are tender.  Take frequent breaks and rest with your legs raised if you have leg cramps or low back pain.  Take warm water baths (sitz baths) to soothe pain or discomfort caused by hemorrhoids. Use hemorrhoid cream if your doctor approves.  If you develop puffy, bulging veins (varicose veins) in your legs: ? Wear support hose or compression stockings as told by your doctor. ? Raise (elevate) your feet for 15 minutes, 3-4 times a day. ? Limit salt in your food. Safety  Wear your seat belt when driving.  Make a list of emergency phone numbers, including numbers for family, friends, the hospital, and police and fire departments. Preparing for your baby's arrival To prepare for the arrival of your baby:  Take prenatal classes.  Practice driving to the hospital.  Visit the hospital and tour the maternity area.  Talk to your work about taking leave once the baby comes.  Pack your hospital bag.  Prepare the baby's room.  Go to your doctor visits.  Buy a rear-facing car seat. Learn how to install it in your car. General instructions  Do not use hot tubs, steam rooms, or saunas.    Do not use any products that contain nicotine or tobacco, such as cigarettes and e-cigarettes. If you need help quitting, ask your doctor.  Do not drink alcohol.  Do not douche or use tampons or scented sanitary pads.  Do not cross your legs for long periods of time.  Do not travel for long distances unless you must. Only do so if your doctor says it is okay.  Visit your dentist if you have not gone during your pregnancy. Use a soft toothbrush to brush your  teeth. Be gentle when you floss.  Avoid cat litter boxes and soil used by cats. These carry germs that can cause birth defects in the baby and can cause a loss of your baby (miscarriage) or stillbirth.  Keep all your prenatal visits as told by your doctor. This is important. Contact a doctor if:  You are not sure if you are in labor or if your water has broken.  You are dizzy.  You have mild cramps or pressure in your lower belly.  You have a nagging pain in your belly area.  You continue to feel sick to your stomach, you throw up, or you have watery poop.  You have bad smelling fluid coming from your vagina.  You have pain when you pee. Get help right away if:  You have a fever.  You are leaking fluid from your vagina.  You are spotting or bleeding from your vagina.  You have severe belly cramps or pain.  You lose or gain weight quickly.  You have trouble catching your breath and have chest pain.  You notice sudden or extreme puffiness (swelling) of your face, hands, ankles, feet, or legs.  You have not felt the baby move in over an hour.  You have severe headaches that do not go away with medicine.  You have trouble seeing.  You are leaking, or you are having a gush of fluid, from your vagina before you are 37 weeks.  You have regular belly spasms (contractions) before you are 37 weeks. Summary  The third trimester is from week 28 through week 40 (months 7 through 9). This time is when your unborn baby is growing very fast.  Follow your doctor's advice about medicine, food, and activity.  Get ready for the arrival of your baby by taking prenatal classes, getting all the baby items ready, preparing the baby's room, and visiting your doctor to be checked.  Get help right away if you are bleeding from your vagina, or you have chest pain and trouble catching your breath, or if you have not felt your baby move in over an hour. This information is not intended to  replace advice given to you by your health care provider. Make sure you discuss any questions you have with your health care provider. Document Released: 07/05/2009 Document Revised: 05/16/2016 Document Reviewed: 05/16/2016 Elsevier Interactive Patient Education  2019 [employer]  

## 2018-06-20 ENCOUNTER — Encounter: Payer: Self-pay | Admitting: Certified Nurse Midwife

## 2018-06-20 LAB — CBC
Hematocrit: 34.6 % (ref 34.0–46.6)
Hemoglobin: 11.3 g/dL (ref 11.1–15.9)
MCH: 30.8 pg (ref 26.6–33.0)
MCHC: 32.7 g/dL (ref 31.5–35.7)
MCV: 94 fL (ref 79–97)
Platelets: 271 10*3/uL (ref 150–450)
RBC: 3.67 x10E6/uL — ABNORMAL LOW (ref 3.77–5.28)
RDW: 12.8 % (ref 11.7–15.4)
WBC: 10.5 10*3/uL (ref 3.4–10.8)

## 2018-06-20 LAB — VARICELLA ZOSTER ANTIBODY, IGG: Varicella zoster IgG: 160 index — ABNORMAL LOW (ref >165)

## 2018-06-20 LAB — GLUCOSE, 1 HOUR GESTATIONAL: Gestational Diabetes Screen: 115 mg/dL (ref 65–139)

## 2018-06-20 LAB — RPR: RPR Ser Ql: NONREACTIVE

## 2018-06-23 NOTE — Progress Notes (Signed)
[facility] [address] [place], [place] [address] Phone: [phone] Subjective:    CC: Low back pain  IWP:[id number]  Suzanne Ross is a 28 y.o. female coming in with complaint of low back. Pain increases with physical activity. Does have radicular symptoms into the left leg and cervical spine with physical activity as well.  Patient does have some discomfort.  Patient is pregnant.  Doing well with this.  Still a lot unknown about where patient is moving.     Past Medical History:  Diagnosis Date  . Broken neck (HCC)   . Medical history non-contributory    Past Surgical History:  Procedure Laterality Date  . ANKLE FRACTURE SURGERY Left   . RHINOPLASTY    . TONSILLECTOMY    . WISDOM TOOTH EXTRACTION     Social History   Socioeconomic History  . Marital status: Married    Spouse name: Not on file  . Number of children: Not on file  . Years of education: Not on file  . Highest education level: Not on file  Occupational History  . Not on file  Social Needs  . Financial resource strain: Not on file  . Food insecurity:    Worry: Not on file    Inability: Not on file  . Transportation needs:    Medical: Not on file    Non-medical: Not on file  Tobacco Use  . Smoking status: Never Smoker  . Smokeless tobacco: Never Used  Substance and Sexual Activity  . Alcohol use: No    Alcohol/week: 0.0 standard drinks  . Drug use: No  . Sexual activity: Yes    Partners: Male    Birth control/protection: None  Lifestyle  . Physical activity:    Days per week: Not on file    Minutes per session: Not on file  . Stress: Not on file  Relationships  . Social connections:    Talks on phone: Not on file    Gets together: Not on file    Attends religious service: Not on file    Active member of club or organization: Not on file    Attends meetings of clubs or organizations: Not on file    Relationship status: Not on file  Other Topics Concern  .  Not on file  Social History Narrative  . Not on file   No Known Allergies Family History  Problem Relation Age of Onset  . Hypertension Maternal Grandmother   . Diabetes Other        fob's-father       Current Outpatient Medications (Analgesics):  .  acetaminophen (TYLENOL) 325 MG tablet, Take 650 mg by mouth every 6 (six) hours as needed.   Current Outpatient Medications (Other):  Marland Kitchen  Prenatal Vit-Fe Fumarate-FA (PRENATAL 1+1 PO), Take 1 tablet by mouth daily.     Past medical history, social, surgical and family history all reviewed in electronic medical record.  No pertanent information unless stated regarding to the chief complaint.   Review of Systems:  No headache, visual changes, nausea, vomiting, diarrhea, constipation, dizziness, abdominal pain, skin rash, fevers, chills, night sweats, weight loss, swollen lymph nodes, body aches, joint swelling, muscle aches, chest pain, shortness of breath, mood changes.   Objective  Blood pressure 104/62, pulse 88, height 5\' 5"  (1.651 m), weight 175 lb (79.4 kg), SpO2 99 %, currently breastfeeding.    General: No apparent distress alert and oriented x3 mood and affect normal, dressed appropriately.  HEENT: Pupils equal, extraocular movements intact  Respiratory: Patient's speak in full sentences and does not appear short of breath  Cardiovascular: No lower extremity edema, non tender, no erythema  Skin: Warm dry intact with no signs of infection or rash on extremities or on axial skeleton.  Abdomen: Soft and gravid Neuro: Cranial nerves II through XII are intact, neurovascularly intact in all extremities with 2+ DTRs and 2+ pulses.  Lymph: No lymphadenopathy of posterior or anterior cervical chain or axillae bilaterally.  Gait normal with good balance and coordination.  MSK:  Non tender with full range of motion and good stability and symmetric strength and tone of shoulders, elbows, wrist, hip, knee and ankles bilaterally.       Back exam is has some increase in lordosis patient does have some tightness of Faber on the right side.  Negative straight leg test.  Tenderness over the right sacroiliac joint.  Osteopathic findings C6 flexed rotated and side bent left T3 extended rotated and side bent right inhaled third rib T7 extended rotated and side bent left L4 flexed rotated and side bent left  Sacrum right on right     Impression and Recommendations:     This case required medical decision making of moderate complexity. The above documentation has been reviewed and is accurate and complete [name], DO       Note: This dictation was prepared with Dragon dictation along with smaller phrase technology. Any transcriptional errors that result from this process are unintentional.

## 2018-06-24 ENCOUNTER — Ambulatory Visit (INDEPENDENT_AMBULATORY_CARE_PROVIDER_SITE_OTHER): Payer: PRIVATE HEALTH INSURANCE | Admitting: Family Medicine

## 2018-06-24 ENCOUNTER — Encounter: Payer: Self-pay | Admitting: Family Medicine

## 2018-06-24 VITALS — BP 104/62 | HR 88 | Ht 65.0 in | Wt 175.0 lb

## 2018-06-24 DIAGNOSIS — S338XXD Sprain of other parts of lumbar spine and pelvis, subsequent encounter: Secondary | ICD-10-CM

## 2018-06-24 DIAGNOSIS — M999 Biomechanical lesion, unspecified: Secondary | ICD-10-CM

## 2018-06-24 NOTE — Assessment & Plan Note (Signed)
Increase lordosis  Patient is pregnant.  Discussed posture and ergonomics.  Discussed which activities to do

## 2018-06-24 NOTE — Patient Instructions (Signed)
Good to see you  Keep working on the exercises  See me again In 3-4 weeks

## 2018-06-24 NOTE — Assessment & Plan Note (Signed)
Decision today to treat with OMT was based on Physical Exam  After verbal consent patient was treated with HVLA, ME, FPR techniques in cervical, thoracic, rib,  lumbar and sacral areas  Patient tolerated the procedure well with improvement in symptoms  Patient given exercises, stretches and lifestyle modifications  See medications in patient instructions if given  Patient will follow up in 4-8 weeks 

## 2018-07-02 ENCOUNTER — Encounter: Payer: Self-pay | Admitting: Certified Nurse Midwife

## 2018-07-02 ENCOUNTER — Ambulatory Visit (INDEPENDENT_AMBULATORY_CARE_PROVIDER_SITE_OTHER): Payer: PRIVATE HEALTH INSURANCE | Admitting: Certified Nurse Midwife

## 2018-07-02 VITALS — BP 112/65 | HR 88 | Wt 180.4 lb

## 2018-07-02 DIAGNOSIS — Z3492 Encounter for supervision of normal pregnancy, unspecified, second trimester: Secondary | ICD-10-CM

## 2018-07-02 LAB — POCT URINALYSIS DIPSTICK OB
Bilirubin, UA: NEGATIVE
GLUCOSE, UA: NEGATIVE
Ketones, UA: NEGATIVE
Leukocytes, UA: NEGATIVE
Nitrite, UA: NEGATIVE
POC,PROTEIN,UA: NEGATIVE
RBC UA: NEGATIVE
Spec Grav, UA: 1.01 (ref 1.010–1.025)
Urobilinogen, UA: 0.2 E.U./dL
pH, UA: 5 (ref 5.0–8.0)

## 2018-07-02 NOTE — Patient Instructions (Signed)
How a Baby Grows During Pregnancy    Pregnancy begins when a female's sperm enters a female's egg (fertilization). Fertilization usually happens in one of the tubes (fallopian tubes) that connect the ovaries to the womb (uterus). The fertilized egg moves down the fallopian tube to the uterus. Once it reaches the uterus, it implants into the lining of the uterus and begins to grow.  For the first 10 weeks, the fertilized egg is called an embryo. After 10 weeks, it is called a fetus. As the fetus continues to grow, it receives oxygen and nutrients through tissue (placenta) that grows to support the developing baby. The placenta is the life support system for the baby. It provides oxygen and nutrition and removes waste.  Learning as much as you can about your pregnancy and how your baby is developing can help you enjoy the experience. It can also make you aware of when there might be a problem and when to ask questions.  How long does a typical pregnancy last?  A pregnancy usually lasts 280 days, or about 40 weeks. Pregnancy is divided into three periods of growth, also called trimesters:   First trimester: 0-12 weeks.   Second trimester: 13-27 weeks.   Third trimester: 28-40 weeks.  The day when your baby is ready to be born (full term) is your estimated date of delivery.  How does my baby develop month by month?  First month   The fertilized egg attaches to the inside of the uterus.   Some cells will form the placenta. Others will form the fetus.   The arms, legs, brain, spinal cord, lungs, and heart begin to develop.   At the end of the first month, the heart begins to beat.  Second month   The bones, inner ear, eyelids, hands, and feet form.   The genitals develop.   By the end of 8 weeks, all major organs are developing.  Third month   All of the internal organs are forming.   Teeth develop below the gums.   Bones and muscles begin to grow. The spine can flex.   The skin is transparent.   Fingernails  and toenails begin to form.   Arms and legs continue to grow longer, and hands and feet develop.   The fetus is about 3 inches (7.6 cm) long.  Fourth month   The placenta is completely formed.   The external sex organs, neck, outer ear, eyebrows, eyelids, and fingernails are formed.   The fetus can hear, swallow, and move its arms and legs.   The kidneys begin to produce urine.   The skin is covered with a white, waxy coating (vernix) and very fine hair (lanugo).  Fifth month   The fetus moves around more and can be felt for the first time (quickening).   The fetus starts to sleep and wake up and may begin to suck its finger.   The nails grow to the end of the fingers.   The organ in the digestive system that makes bile (gallbladder) functions and helps to digest nutrients.   If your baby is a girl, eggs are present in her ovaries. If your baby is a boy, testicles start to move down into his scrotum.  Sixth month   The lungs are formed.   The eyes open. The brain continues to develop.   Your baby has fingerprints and toe prints. Your baby's hair grows thicker.   At the end of the second trimester, the   fetus is about 9 inches (22.9 cm) long.  Seventh month   The fetus kicks and stretches.   The eyes are developed enough to sense changes in light.   The hands can make a grasping motion.   The fetus responds to sound.  Eighth month   All organs and body systems are fully developed and functioning.   Bones harden, and taste buds develop. The fetus may hiccup.   Certain areas of the brain are still developing. The skull remains soft.  Ninth month   The fetus gains about  lb (0.23 kg) each week.   The lungs are fully developed.   Patterns of sleep develop.   The fetus's head typically moves into a head-down position (vertex) in the uterus to prepare for birth.   The fetus weighs 6-9 lb (2.72-4.08 kg) and is 19-20 inches (48.26-50.8 cm) long.  What can I do to have a healthy pregnancy and help  my baby develop?  General instructions   Take prenatal vitamins as directed by your health care provider. These include vitamins such as folic acid, iron, calcium, and vitamin D. They are important for healthy development.   Take medicines only as directed by your health care provider. Read labels and ask a pharmacist or your health care provider whether over-the-counter medicines, supplements, and prescription drugs are safe to take during pregnancy.   Keep all follow-up visits as directed by your health care provider. This is important. Follow-up visits include prenatal care and screening tests.  How do I know if my baby is developing well?  At each prenatal visit, your health care provider will do several different tests to check on your health and keep track of your baby's development. These include:   Fundal height and position.  ? Your health care provider will measure your growing belly from your pubic bone to the top of the uterus using a tape measure.  ? Your health care provider will also feel your belly to determine your baby's position.   Heartbeat.  ? An ultrasound in the first trimester can confirm pregnancy and show a heartbeat, depending on how far along you are.  ? Your health care provider will check your baby's heart rate at every prenatal visit.   Second trimester ultrasound.  ? This ultrasound checks your baby's development. It also may show your baby's gender.  What should I do if I have concerns about my baby's development?  Always talk with your health care provider about any concerns that you may have about your pregnancy and your baby.  Summary   A pregnancy usually lasts 280 days, or about 40 weeks. Pregnancy is divided into three periods of growth, also called trimesters.   Your health care provider will monitor your baby's growth and development throughout your pregnancy.   Follow your health care provider's recommendations about taking prenatal vitamins and medicines during  your pregnancy.   Talk with your health care provider if you have any concerns about your pregnancy or your developing baby.  This information is not intended to replace advice given to you by your health care provider. Make sure you discuss any questions you have with your health care provider.  Document Released: 09/27/2007 Document Revised: 02/21/2017 Document Reviewed: 02/21/2017  Elsevier Interactive Patient Education  2019 [employer] Inc[employer]

## 2018-07-02 NOTE — Progress Notes (Signed)
ROB doing well. Feels good movement. Pt state she has concern regarding labor given she is already have back and tail bone discomfort. Reassurance given, discussed using different postions in labor and birth to help her tolerate. She verbalizes and agrees to plan. Follow up 2 wks.   Doreene Burke, CNM

## 2018-07-04 ENCOUNTER — Ambulatory Visit (INDEPENDENT_AMBULATORY_CARE_PROVIDER_SITE_OTHER): Payer: PRIVATE HEALTH INSURANCE | Admitting: Certified Nurse Midwife

## 2018-07-04 ENCOUNTER — Other Ambulatory Visit (HOSPITAL_COMMUNITY)
Admission: RE | Admit: 2018-07-04 | Discharge: 2018-07-04 | Disposition: A | Discharge status: Home / Self Care | Payer: PRIVATE HEALTH INSURANCE | Source: Ambulatory Visit | Attending: Certified Nurse Midwife | Admitting: Certified Nurse Midwife

## 2018-07-04 ENCOUNTER — Other Ambulatory Visit: Payer: Self-pay

## 2018-07-04 VITALS — BP 115/87 | HR 106 | Wt 177.2 lb

## 2018-07-04 DIAGNOSIS — Z3492 Encounter for supervision of normal pregnancy, unspecified, second trimester: Secondary | ICD-10-CM | POA: Insufficient documentation

## 2018-07-04 DIAGNOSIS — N898 Other specified noninflammatory disorders of vagina: Secondary | ICD-10-CM

## 2018-07-04 DIAGNOSIS — O26893 Other specified pregnancy related conditions, third trimester: Secondary | ICD-10-CM

## 2018-07-04 DIAGNOSIS — Z3A30 30 weeks gestation of pregnancy: Secondary | ICD-10-CM

## 2018-07-04 DIAGNOSIS — R102 Pelvic and perineal pain: Secondary | ICD-10-CM

## 2018-07-04 LAB — POCT URINALYSIS DIPSTICK OB
Bilirubin, UA: NEGATIVE
Blood, UA: NEGATIVE
Glucose, UA: NEGATIVE
Ketones, UA: NEGATIVE
Leukocytes, UA: NEGATIVE
Nitrite, UA: NEGATIVE
POC,PROTEIN,UA: NEGATIVE
Spec Grav, UA: <=1.005 — AB (ref 1.010–1.025)
Urobilinogen, UA: 0.2 E.U./dL
pH, UA: 8 (ref 5.0–8.0)

## 2018-07-04 NOTE — Patient Instructions (Addendum)
WE WOULD LOVE TO HEAR FROM YOU!!!!   Thank you Carlena Hurl for visiting [facility].  Providing our patients with the best experience possible is really important to [place], and we hope that you felt that on your recent visit. The most valuable feedback we get comes from YOU!!    If you receive a survey please take a couple of minutes to let us know how we did.Thank you for continuing to trust [place] with your care.   [facility]   Back Pain in Pregnancy Back pain during pregnancy is common. Back pain may be caused by several factors that are related to changes during your pregnancy. Follow these instructions at home: Managing pain, stiffness, and swelling      If directed, for sudden (acute) back pain, put ice on the painful area. ? Put ice in a plastic bag. ? Place a towel between your skin and the bag. ? Leave the ice on for 20 minutes, 2-3 times per day.  If directed, apply heat to the affected area before you exercise. Use the heat source that your health care provider recommends, such as a moist heat pack or a heating pad. ? Place a towel between your skin and the heat source. ? Leave the heat on for 20-30 minutes. ? Remove the heat if your skin turns bright red. This is especially important if you are unable to feel pain, heat, or cold. You may have a greater risk of getting burned.  If directed, massage the affected area. Activity  Exercise as told by your health care provider. Gentle exercise is the best way to prevent or manage back pain.  Listen to your body when lifting. If lifting hurts, ask for help or bend your knees. This uses your leg muscles instead of your back muscles.  Squat down when picking up something from the floor. Do not bend over.  Only use bed rest for short periods as told by your health care provider. Bed rest should only be used for the most severe episodes of back pain. Standing, sitting, and lying down  Do  not stand in one place for long periods of time.  Use good posture when sitting. Make sure your head rests over your shoulders and is not hanging forward. Use a pillow on your lower back if necessary.  Try sleeping on your side, preferably the left side, with a pregnancy support pillow or 1-2 regular pillows between your legs. ? If you have back pain after a night's rest, your bed may be too soft. ? A firm mattress may provide more support for your back during pregnancy. General instructions  Do not wear high heels.  Eat a healthy diet. Try to gain weight within your health care provider's recommendations.  Use a maternity girdle, elastic sling, or back brace as told by your health care provider.  Take over-the-counter and prescription medicines only as told by your health care provider.  Work with a physical therapist or massage therapist to find ways to manage back pain. Acupuncture or massage therapy may be helpful.  Keep all follow-up visits as told by your health care provider. This is important. Contact a health care provider if:  Your back pain interferes with your daily activities.  You have increasing pain in other parts of your body. Get help right away if:  You develop numbness, tingling, weakness, or problems with the use of your arms or legs.  You develop severe back pain that is not  controlled with medicine.  You have a change in bowel or bladder control.  You develop shortness of breath, dizziness, or you faint.  You develop nausea, vomiting, or sweating.  You have back pain that is a rhythmic, cramping pain similar to labor pains. Labor pain is usually 1-2 minutes apart, lasts for about 1 minute, and involves a bearing down feeling or pressure in your pelvis.  You have back pain and your water breaks or you have vaginal bleeding.  You have back pain or numbness that travels down your leg.  Your back pain developed after you fell.  You develop pain on one  side of your back.  You see blood in your urine.  You develop skin blisters in the area of your back pain. Summary  Back pain may be caused by several factors that are related to changes during your pregnancy.  Follow instructions as told by your health care provider for managing pain, stiffness, and swelling.  Exercise as told by your health care provider. Gentle exercise is the best way to prevent or manage back pain.  Take over-the-counter and prescription medicines only as told by your health care provider.  Keep all follow-up visits as told by your health care provider. This is important. This information is not intended to replace advice given to you by your health care provider. Make sure you discuss any questions you have with your health care provider. Document Released: 07/19/2005 Document Revised: 09/26/2017 Document Reviewed: 09/26/2017 Elsevier Interactive Patient Education  2019 [employer].

## 2018-07-05 LAB — CERVICOVAGINAL ANCILLARY ONLY
Bacterial vaginitis: NEGATIVE
Candida vaginitis: NEGATIVE

## 2018-07-08 NOTE — Progress Notes (Signed)
Subjective:   Suzanne Ross is a 28 y.o. G2P1001 [redacted]w[redacted]d being seen today for problem obstetrical visit.  Patient reports increased vaginal discharge and hearing a "pop last night at 2100, similar to when her water broke" with last child. Notes new onset "tingling" and sore legs today.   No contractions, vaginal bleeding or leaking of fluid. Reports fetal movement.  Denies difficulty breathing or respiratory distress, chest pain, abdominal pain, vaginal bleeding, dysuria, and leg pain or swelling.   Accompanied by spouse and son.   The following portions of the patient's history were reviewed and updated as appropriate: allergies, current medications, past family history, past medical history, past social history, past surgical history and problem list.   Objective:   BP 115/87   Pulse (!) 106   Wt 177 lb 3 oz (80.4 kg)   BMI 29.49 kg/m   FHT: Fetal Heart Rate (bpm): 152  Fetal Movement: Movement: Present    Abdomen:  soft, gravid, appropriate for gestational age,non-tender  Vaginal:  Discharge, white -thin; negative pooling; negative nitrazine  Cervix: Visually closed   Urinalysis    Component Value Date/Time   GLUCOSEU Negative 07/04/2018 1122   BILIRUBINUR neg 07/04/2018 1122   UROBILINOGEN 0.2 07/04/2018 1122   NITRITE neg 07/04/2018 1122   LEUKOCYTESUR Negative 07/04/2018 1122   Assessment:   Pregnancy:  G2P1001 at [redacted]w[redacted]d  1. Second trimester pregnancy  - POC Urinalysis Dipstick OB - Cervicovaginal ancillary only  2. Vaginal discharge during pregnancy in third trimester  - Cervicovaginal ancillary only  3. Pelvic pain affecting pregnancy in third trimester, antepartum  - Cervicovaginal ancillary only  Plan:   Discussed home treatment measures including use of abdominal support and V2 supporter.   Preterm labor symptoms: vaginal bleeding, contractions and leaking of fluid reviewed in detail.  Fetal movement precautions reviewed.  Follow up in 2 weeks as  previously scheduled or sooner if needed.   Gunnar Bulla, CNM [facility], [facility]

## 2018-07-16 ENCOUNTER — Ambulatory Visit: Payer: PRIVATE HEALTH INSURANCE | Admitting: Family Medicine

## 2018-07-17 NOTE — Progress Notes (Signed)
Coronavirus (COVID-19) Are you at risk?  Are you at risk for the Coronavirus (COVID-19)?  To be considered HIGH RISK for Coronavirus (COVID-19), you have to meet the following criteria:  . Traveled to [place], [place], [place], [place] or [place]; or in the [place] to [place], [place], [place], or [place]; and have fever, cough, and shortness of breath within the last 2 weeks of travel OR . Been in close contact with a person diagnosed with COVID-19 within the last 2 weeks and have fever, cough, and shortness of breath . IF YOU DO NOT MEET THESE CRITERIA, YOU ARE CONSIDERED LOW RISK FOR COVID-19.  What to do if you are HIGH RISK for COVID-19?  . If you are having a medical emergency, call 911. . Seek medical care right away. Before you go to a doctor's office, urgent care or emergency department, call ahead and tell them about your recent travel, contact with someone diagnosed with COVID-19, and your symptoms. You should receive instructions from your physician's office regarding next steps of care.  . When you arrive at healthcare provider, tell the healthcare staff immediately you have returned from visiting [place], [place], [place], [place] or [place]; or traveled in the [place] to [place], [place], [place], or [place]; in the last two weeks or you have been in close contact with a person diagnosed with COVID-19 in the last 2 weeks.   . Tell the health care staff about your symptoms: fever, cough and shortness of breath. . After you have been seen by a medical provider, you will be either: o Tested for (COVID-19) and discharged home on quarantine except to seek medical care if symptoms worsen, and asked to  - Stay home and avoid contact with others until you get your results (4-5 days)  - Avoid travel on public transportation if possible (such as bus, train, or airplane) or o Sent to the Emergency Department by EMS for evaluation, COVID-19 testing, and possible  admission depending on your condition and test results.  What to do if you are LOW RISK for COVID-19?  Reduce your risk of any infection by using the same precautions used for avoiding the common cold or flu:  . Wash your hands often with soap and warm water for at least 20 seconds.  If soap and water are not readily available, use an alcohol-based hand sanitizer with at least 60% alcohol.  . If coughing or sneezing, cover your mouth and nose by coughing or sneezing into the elbow areas of your shirt or coat, into a tissue or into your sleeve (not your hands). . Avoid shaking hands with others and consider head nods or verbal greetings only. . Avoid touching your eyes, nose, or mouth with unwashed hands.  . Avoid close contact with people who are sick. . Avoid places or events with large numbers of people in one location, like concerts or sporting events. . Carefully consider travel plans you have or are making. . If you are planning any travel outside or inside the [place], visit the CDC's Travelers' Health webpage for the latest health notices. . If you have some symptoms but not all symptoms, continue to monitor at home and seek medical attention if your symptoms worsen. . If you are having a medical emergency, call 911.  Spoke with pt denies any sx. Yariel Ferraris, CMA   ADDITIONAL HEALTHCARE OPTIONS FOR PATIENTS  [facility] / e-Visit: [email or web address]           [facility]: [phone]  [facility]: [phone]                   [facility]: [phone]  

## 2018-07-18 ENCOUNTER — Encounter: Payer: PRIVATE HEALTH INSURANCE | Admitting: Obstetrics and Gynecology

## 2018-07-18 ENCOUNTER — Ambulatory Visit (INDEPENDENT_AMBULATORY_CARE_PROVIDER_SITE_OTHER): Payer: PRIVATE HEALTH INSURANCE | Admitting: Obstetrics and Gynecology

## 2018-07-18 ENCOUNTER — Other Ambulatory Visit: Payer: Self-pay

## 2018-07-18 VITALS — BP 120/68 | HR 90 | Wt 181.7 lb

## 2018-07-18 DIAGNOSIS — Z3493 Encounter for supervision of normal pregnancy, unspecified, third trimester: Secondary | ICD-10-CM

## 2018-07-18 LAB — POCT URINALYSIS DIPSTICK OB
Bilirubin, UA: NEGATIVE
Blood, UA: NEGATIVE
Glucose, UA: NEGATIVE
Ketones, UA: NEGATIVE
Leukocytes, UA: NEGATIVE
Nitrite, UA: NEGATIVE
PROTEIN: NEGATIVE
Spec Grav, UA: 1.01 (ref 1.010–1.025)
Urobilinogen, UA: 0.2 E.U./dL
pH, UA: 6 (ref 5.0–8.0)

## 2018-07-18 MED ORDER — SERTRALINE HCL 25 MG PO TABS: 25.0000 mg | ORAL_TABLET | Freq: Every day | ORAL | 3 refills | Status: DC

## 2018-07-18 NOTE — Progress Notes (Signed)
ROB- pt is still having pain, feels like a lot of pressure pain

## 2018-07-18 NOTE — Progress Notes (Signed)
ROB- discussed pressure, PHQ & GAD screening repeated, discussed covid-19, started on zoloft 25mg , counseled on medication, expected results and side effects and planned course of treatment up to 6 months PP.  Depression screen [facility] 2/9 07/18/2018 04/12/2018 11/18/2015  Decreased Interest 3 3 0  Down, Depressed, Hopeless 2 3 1   PHQ - 2 Score 5 6 1   Altered sleeping 2 3 2   Tired, decreased energy 3 1 2   Change in appetite 2 0 1  Feeling bad or failure about yourself  2 3 1   Trouble concentrating 1 0 0  Moving slowly or fidgety/restless 0 0 0  Suicidal thoughts 1 1 1   PHQ-9 Score 16 14 8   Difficult doing work/chores Somewhat difficult Not difficult at all -    GAD 7 : Generalized Anxiety Score 07/18/2018 07/18/2018 05/01/2018 04/12/2018  Nervous, Anxious, on Edge 2 3 1 3   Control/stop worrying 1 2 1 3   Worry too much - different things 2 3 2 3   Trouble relaxing 1 1 0 1  Restless 1 1 0 0  Easily annoyed or irritable 1 3 3 3   Afraid - awful might happen 1 2 2 3   Total GAD 7 Score 9 15 9 16   Anxiety Difficulty Somewhat difficult Somewhat difficult Somewhat difficult Not difficult at all

## 2018-07-18 NOTE — Patient Instructions (Addendum)
FREQUENTLY ASKED QUESTIONS FOR OBSTETRICS/PEDIATRICS    Q: Why are visitor restrictions different for maternity care areas?  [place] is restricting visitors for the duration of the patient's hospitalization. The birth of a child involves the mother, considered the patient, and a birthing partner. These are unprecedented times and we are making the exception to allow a birthing partner to be a part of the patient unit. No other guests will be allowed in our [place] at [facility] and at [facility].   Q: Are credentialed doulas allowed to support their existing patients?  We acknowledge the value these doula partnerships offer our care teams and many birthing families in our communities. Each laboring mother is allowed one birthing partner of the patient's choosing for her entire hospitalization.   Q: Are visitor restrictions different for hospitalized children?  Pediatric patients (infants and children under 1 years of age), such as those in the Children's Unit, Pediatric ICU and NICU, will be allowed two visitors (parents or legal guardians)   Q: Are pregnant women at an increased risk for COVID-19?  The [employer] of Obstetricians and Gynecologists (ACOG) is monitoring closely the coronavirus pandemic. With the limited information available, data does not indicate pregnant women are at an increased risk. However, pregnant women are known to be at greater risk for respiratory infections like flu. With that in mind, expectant mothers are considered an at-risk population for COVID-19, according to ACOG.   Q: Are newborns at an increased risk for COVID-19?  A limited sample of COVID-19 data with newborns indicates the virus is not transferred to the infant during pregnancy. However, postpartum separation is recommended by the Centers for  Disease Control (CDC). As a result [place] recommends and strongly encourages temporary separation of moms and babies who test positive for COVID-19 or are awaiting results to rule out COVID-19 based on CDC guidelines.   Q: If you have a suspected case of COVID-19, is the NICU couplet care room an option?  No. If either patient is considered at-risk for having COVID-19, the [place] [facility] will not use the NICU couplet care rooms for that family.   Q: [place] is urging that elective procedures be postponed. What is considered elective for women's and children's service line?  NOT ELECTIVE: Obstetric procedures, even those with an element of choice on timing, are not considered elective. Circumcisions are considered elective procedures, however, these do not deplete blood products and other resources, which is the spirit in which the COVID-19 postponement of elective procedures was intended. Therefore, circumcisions will be allowed.   ELECTIVE: Postpartum tubal ligations are considered elective and should be postponed. Q&A for Obstetricians, Gynecologists and Pediatricians  Published July 12, 2018   [facility] supports as much as possible the medical care  team working with the patient's individual needs to address timing during these unprecedented times. We seek the support of our medical care team in preserving needed resources throughout our crisis response to COVID-19.   Q: How does COVID-19 impact breastfeeding?  Breastmilk is safe for your baby - even if the mother has tested positive for COVID-19. If a COVID-19+ mother decides to breastfeed while inpatient and after discharge, we suggest proper protective equipment be worn and hand hygiene be performed before and after feeding the infant. The new mother also has the option to pump her milk and have a healthy family member feed the baby to protect the baby from getting the virus.   Q: Should we urge  patients to avoid baby showers and large gatherings?  Yes. As has been recommended for all citizens in our communities, gatherings of 10 or more should be avoided - pregnant or not. Seek creative options for "hosting" baby showers through electronic means that honor the request for social distancing during this time of heightened awareness.   Q: Should patients miss their prenatal appointments?  No. Prenatal visits are NOT elective. While we want to limit contact and exposure, prenatal care is vital right now. Contact your physician's office if you have concerns about your visits. We are limiting outpatient office visits to the patient and one guest in order to reduce the potential for exposure.   Q: What if a pregnant woman feels sick? Should she miss her prenatal visit then?  A pregnant woman experiencing coronavirus-like symptoms (i.e., cough, fever, difficulty breathing, shortness of breath, gastrointestinal issues) should contact her pregnancy care provider by phone. Her medical professional can best determine whether she should use a video visit or possibly go to a collection site to be tested for COVID-19. Contacting her primary care provider or her pregnancy care provider is her first step.   Q: What can I do about childbirth education? All the classes are cancelled.  The [facility] [place] will offer online learning to support mothers on their journey. We currently offer Understanding Childbirth, Understanding Breastfeeding and Understanding Newborn Care as an online class. Please visit our website, [email or web address], to register for an online class.   Q: How can I keep from getting COVID-19? Q&A for Obstetricians, Gynecologists and Pediatricians  Published July 12, 2018   Together, we can reduce the risk of exposure to the virus and help you and your family remain healthy and safe. One of the best ways to protect yourself is to wash your hands frequently using soap and  water. Also, you should avoid touching your eyes, nose and mouth with unwashed hands, avoid physical contact with others and practice social distancing.   Q: How are employees being informed about what to do?  [place] leaders receive a daily COVID-19 update and share relevant information with their teams. This is a time when health care professionals are called on to lead within our community. We appreciate our staff's engagement with our COVID-19 updates and encourage them to share best practices on reducing the spread of the virus with our patients and community. We are prepared to provide the exceptional COVID-19 care and coordination our community needs, expects and deserves.   Q: Who's in charge of this issue at [facility]?  The leadership structure and process established to address COVID-19 includes Chief Physician Executive Phoebe Sharps, MD; Infection Prevention Medical Director Carlyle Basques, MD; and Infection Prevention Interim Director Hubert Azure, MSN, RN, CIC, CSPDT. A team  of  experts reflecting a broad spectrum of our workforce is meeting daily to evaluate new information we receive about COVID-19 and to adapt policies and practices accordingly.                         Published July 12, 2018   Sertraline tablets What is this medicine? SERTRALINE (SER tra leen) is used to treat depression. It may also be used to treat obsessive compulsive disorder, panic disorder, post-trauma stress, premenstrual dysphoric disorder (PMDD) or social anxiety. This medicine may be used for other purposes; ask your health care provider or pharmacist if you have questions. COMMON BRAND NAME(S): Zoloft What should I tell my health care provider before I take this medicine? They need to know if you have any of these conditions: -bleeding disorders -bipolar disorder or a family history of bipolar disorder -glaucoma -heart disease -high blood pressure -history of irregular heartbeat  -history of low levels of calcium, magnesium, or potassium in the blood -if you often drink alcohol -liver disease -receiving electroconvulsive therapy -seizures -suicidal thoughts, plans, or attempt; a previous suicide attempt by you or a family member -take medicines that treat or prevent blood clots -thyroid disease -an unusual or allergic reaction to sertraline, other medicines, foods, dyes, or preservatives -pregnant or trying to get pregnant -breast-feeding How should I use this medicine? Take this medicine by mouth with a glass of water. Follow the directions on the prescription label. You can take it with or without food. Take your medicine at regular intervals. Do not take your medicine more often than directed. Do not stop taking this medicine suddenly except upon the advice of your doctor. Stopping this medicine too quickly may cause serious side effects or your condition may worsen. A special MedGuide will be given to you by the pharmacist with each prescription and refill. Be sure to read this information carefully each time. Talk to your pediatrician regarding the use of this medicine in children. While this drug may be prescribed for children as young as 7 years for selected conditions, precautions do apply. Overdosage: If you think you have taken too much of this medicine contact a poison control center or emergency room at once. NOTE: This medicine is only for you. Do not share this medicine with others. What if I miss a dose? If you miss a dose, take it as soon as you can. If it is almost time for your next dose, take only that dose. Do not take double or extra doses. What may interact with this medicine? Do not take this medicine with any of the following medications: -cisapride -dofetilide -dronedarone -linezolid -MAOIs like Carbex, Eldepryl, Marplan, Nardil, and Parnate -methylene blue (injected into a vein) -pimozide -thioridazine This medicine may also interact  with the following medications: -alcohol -amphetamines -aspirin and aspirin-like medicines -certain medicines for depression, anxiety, or psychotic disturbances -certain medicines for fungal infections like ketoconazole, fluconazole, posaconazole, and itraconazole -certain medicines for irregular heart beat like flecainide, quinidine, propafenone -certain medicines for migraine headaches like almotriptan, eletriptan, frovatriptan, naratriptan, rizatriptan, sumatriptan, zolmitriptan -certain medicines for sleep -certain medicines for seizures like carbamazepine, valproic acid, phenytoin -certain medicines that treat or prevent blood clots like warfarin, enoxaparin, dalteparin -cimetidine -digoxin -diuretics -fentanyl -isoniazid -lithium -NSAIDs, medicines for pain and inflammation, like ibuprofen or naproxen -other medicines that prolong the QT interval (cause an abnormal heart rhythm) -rasagiline -safinamide -supplements like St. John's wort, kava kava, valerian -tolbutamide -tramadol -tryptophan This list  may not describe all possible interactions. Give your health care provider a list of all the medicines, herbs, non-prescription drugs, or dietary supplements you use. Also tell them if you smoke, drink alcohol, or use illegal drugs. Some items may interact with your medicine. What should I watch for while using this medicine? Tell your doctor if your symptoms do not get better or if they get worse. Visit your doctor or health care professional for regular checks on your progress. Because it may take several weeks to see the full effects of this medicine, it is important to continue your treatment as prescribed by your doctor. Patients and their families should watch out for new or worsening thoughts of suicide or depression. Also watch out for sudden changes in feelings such as feeling anxious, agitated, panicky, irritable, hostile, aggressive, impulsive, severely restless, overly  excited and hyperactive, or not being able to sleep. If this happens, especially at the beginning of treatment or after a change in dose, call your health care professional. Bonita Quin may get drowsy or dizzy. Do not drive, use machinery, or do anything that needs mental alertness until you know how this medicine affects you. Do not stand or sit up quickly, especially if you are an older patient. This reduces the risk of dizzy or fainting spells. Alcohol may interfere with the effect of this medicine. Avoid alcoholic drinks. Your mouth may get dry. Chewing sugarless gum or sucking hard candy, and drinking plenty of water may help. Contact your doctor if the problem does not go away or is severe. What side effects may I notice from receiving this medicine? Side effects that you should report to your doctor or health care professional as soon as possible: -allergic reactions like skin rash, itching or hives, swelling of the face, lips, or tongue -anxious -black, tarry stools -changes in vision -confusion -elevated mood, decreased need for sleep, racing thoughts, impulsive behavior -eye pain -fast, irregular heartbeat -feeling faint or lightheaded, falls -feeling agitated, angry, or irritable -hallucination, loss of contact with reality -loss of balance or coordination -loss of memory -painful or prolonged erections -restlessness, pacing, inability to keep still -seizures -stiff muscles -suicidal thoughts or other mood changes -trouble sleeping -unusual bleeding or bruising -unusually weak or tired -vomiting Side effects that usually do not require medical attention (report to your doctor or health care professional if they continue or are bothersome): -change in appetite or weight -change in sex drive or performance -diarrhea -increased sweating -indigestion, nausea -tremors This list may not describe all possible side effects. Call your doctor for medical advice about side effects. You may  report side effects to FDA at 1-800-FDA-1088. Where should I keep my medicine? Keep out of the reach of children. Store at room temperature between 15 and 30 degrees C (59 and 86 degrees F). Throw away any unused medicine after the expiration date. NOTE: This sheet is a summary. It may not cover all possible information. If you have questions about this medicine, talk to your doctor, pharmacist, or health care provider.  2019 Elsevier/Gold Standard (2016-04-14 14:17:49)

## 2018-08-06 ENCOUNTER — Telehealth: Payer: Self-pay

## 2018-08-06 NOTE — Telephone Encounter (Signed)
Coronavirus (COVID-19) Are you at risk?  Are you at risk for the Coronavirus (COVID-19)?  To be considered HIGH RISK for Coronavirus (COVID-19), you have to meet the following criteria:  . Traveled to [place], [place], [place], [place] or [place]; or in the [place] to [place], [place], [place], or [place]; and have fever, cough, and shortness of breath within the last 2 weeks of travel OR . Been in close contact with a person diagnosed with COVID-19 within the last 2 weeks and have fever, cough, and shortness of breath . IF YOU DO NOT MEET THESE CRITERIA, YOU ARE CONSIDERED LOW RISK FOR COVID-19.  What to do if you are HIGH RISK for COVID-19?  . If you are having a medical emergency, call 911. . Seek medical care right away. Before you go to a doctor's office, urgent care or emergency department, call ahead and tell them about your recent travel, contact with someone diagnosed with COVID-19, and your symptoms. You should receive instructions from your physician's office regarding next steps of care.  . When you arrive at healthcare provider, tell the healthcare staff immediately you have returned from visiting [place], [place], [place], [place] or [place]; or traveled in the [place] to [place], [place], [place], or [place]; in the last two weeks or you have been in close contact with a person diagnosed with COVID-19 in the last 2 weeks.   . Tell the health care staff about your symptoms: fever, cough and shortness of breath. . After you have been seen by a medical provider, you will be either: o Tested for (COVID-19) and discharged home on quarantine except to seek medical care if symptoms worsen, and asked to  - Stay home and avoid contact with others until you get your results (4-5 days)  - Avoid travel on public transportation if possible (such as bus, train, or airplane) or o Sent to the Emergency Department by EMS for evaluation, COVID-19 testing, and possible  admission depending on your condition and test results.  What to do if you are LOW RISK for COVID-19?  Reduce your risk of any infection by using the same precautions used for avoiding the common cold or flu:  . Wash your hands often with soap and warm water for at least 20 seconds.  If soap and water are not readily available, use an alcohol-based hand sanitizer with at least 60% alcohol.  . If coughing or sneezing, cover your mouth and nose by coughing or sneezing into the elbow areas of your shirt or coat, into a tissue or into your sleeve (not your hands). . Avoid shaking hands with others and consider head nods or verbal greetings only. . Avoid touching your eyes, nose, or mouth with unwashed hands.  . Avoid close contact with people who are Danean Marner. . Avoid places or events with large numbers of people in one location, like concerts or sporting events. . Carefully consider travel plans you have or are making. . If you are planning any travel outside or inside the [place], visit the CDC's Travelers' Health webpage for the latest health notices. . If you have some symptoms but not all symptoms, continue to monitor at home and seek medical attention if your symptoms worsen. . If you are having a medical emergency, call 911. 08/06/18 SCREENING NEG SLS  ADDITIONAL HEALTHCARE OPTIONS FOR PATIENTS  [facility] / e-Visit: [email or web address]         [facility]: [phone]    [facility]: [phone]                   [facility]: [phone]  

## 2018-08-07 ENCOUNTER — Telehealth: Payer: Self-pay | Admitting: Certified Nurse Midwife

## 2018-08-07 ENCOUNTER — Other Ambulatory Visit: Payer: Self-pay

## 2018-08-07 ENCOUNTER — Encounter: Payer: Self-pay | Admitting: Certified Nurse Midwife

## 2018-08-07 ENCOUNTER — Ambulatory Visit (INDEPENDENT_AMBULATORY_CARE_PROVIDER_SITE_OTHER): Payer: PRIVATE HEALTH INSURANCE | Admitting: Certified Nurse Midwife

## 2018-08-07 VITALS — BP 120/74 | HR 91 | Wt 183.3 lb

## 2018-08-07 DIAGNOSIS — Z3493 Encounter for supervision of normal pregnancy, unspecified, third trimester: Secondary | ICD-10-CM

## 2018-08-07 DIAGNOSIS — Z3685 Encounter for antenatal screening for Streptococcus B: Secondary | ICD-10-CM

## 2018-08-07 DIAGNOSIS — Z113 Encounter for screening for infections with a predominantly sexual mode of transmission: Secondary | ICD-10-CM

## 2018-08-07 LAB — POCT URINALYSIS DIPSTICK OB
Bilirubin, UA: NEGATIVE
Blood, UA: NEGATIVE
Glucose, UA: NEGATIVE
Ketones, UA: NEGATIVE
Leukocytes, UA: NEGATIVE
Nitrite, UA: NEGATIVE
POC,PROTEIN,UA: NEGATIVE
Spec Grav, UA: 1.01 (ref 1.010–1.025)
Urobilinogen, UA: 0.2 E.U./dL
pH, UA: 7 (ref 5.0–8.0)

## 2018-08-07 NOTE — Progress Notes (Signed)
ROB doing well. Complains of pressure and irregular contractions. PT has history of PTD @ 36 wks. GBS and cultures today. SVE 2/50/-3. Labor precautions reviewed. PHq9 screen -3 since starting zoloft. Pt state she feels so much better. Follow up 2 wks or PRN.   Doreene Burke, CNM

## 2018-08-07 NOTE — Telephone Encounter (Signed)
In pt's chart due to Office manager asking to review pt's apt. Double booked at 9:00 am on [facility] sched. Thank you.

## 2018-08-07 NOTE — Patient Instructions (Signed)
Group B Streptococcus Infection During Pregnancy  Group B Streptococcus (GBS) is a type of bacteria (Streptococcus agalactiae) that is often found in healthy people, commonly in the rectum, vagina, and intestines. In people who are healthy and not pregnant, the bacteria rarely cause serious illness or complications. However, women who test positive for GBS during pregnancy can pass the bacteria to their baby during childbirth, which can cause serious infection in the baby after birth. Women with GBS may also have infections during their pregnancy or immediately after childbirth, such as such as urinary tract infections (UTIs) or infections of the uterus (uterine infections). Having GBS also increases a woman's risk of complications during pregnancy, such as early (preterm) labor or delivery, miscarriage, or stillbirth. Routine testing (screening) for GBS is recommended for all pregnant women. What increases the risk? You may have a higher risk for GBS infection during pregnancy if you had one during a past pregnancy. What are the signs or symptoms? In most cases, GBS infection does not cause symptoms in pregnant women. Signs and symptoms of a possible GBS-related infection may include:  Labor starting before the 37th week of pregnancy.  A UTI or bladder infection, which may cause: ? Fever. ? Pain or burning during urination. ? Frequent urination.  Fever during labor, along with: ? Bad-smelling discharge. ? Uterine tenderness. ? Rapid heartbeat in the mother, baby, or both. Rare but serious symptoms of a possible GBS-related infection in women include:  Blood infection (septicemia). This may cause fever, chills, or confusion.  Lung infection (pneumonia). This may cause fever, chills, cough, rapid breathing, difficulty breathing, or chest pain.  Bone, joint, skin, or soft tissue infection. How is this diagnosed? You may be screened for GBS between week 35 and week 37 of your pregnancy. If  you have symptoms of preterm labor, you may be screened earlier. This condition is diagnosed based on lab test results from:  A swab of fluid from the vagina and rectum.  A urine sample. How is this treated? This condition is treated with antibiotic medicine. When you go into labor, or as soon as your water breaks (your membranes rupture), you will be given antibiotics through an IV tube. Antibiotics will continue until after you give birth. If you are having a cesarean delivery, you do not need antibiotics unless your membranes have already ruptured. Follow these instructions at home:  Take over-the-counter and prescription medicines only as told by your health care provider.  Take your antibiotic medicine as told by your health care provider. Do not stop taking the antibiotic even if you start to feel better.  Keep all pre-birth (prenatal) visits and follow-up visits as told by your health care provider. This is important. Contact a health care provider if:  You have pain or burning when you urinate.  You have to urinate frequently.  You have a fever or chills.  You develop a bad-smelling vaginal discharge. Get help right away if:  Your membranes rupture.  You go into labor.  You have severe pain in your abdomen.  You have difficulty breathing.  You have chest pain. This information is not intended to replace advice given to you by your health care provider. Make sure you discuss any questions you have with your health care provider. Document Released: 07/18/2007 Document Revised: 11/05/2015 Document Reviewed: 11/04/2015 Elsevier Interactive Patient Education  2019 Elsevier Inc.  

## 2018-08-07 NOTE — Progress Notes (Signed)
ROB- pt is doing well 

## 2018-08-09 LAB — STREP GP B NAA: Strep Gp B NAA: NEGATIVE

## 2018-08-10 LAB — GC/CHLAMYDIA PROBE AMP
Chlamydia trachomatis, NAA: NEGATIVE
Neisseria Gonorrhoeae by PCR: NEGATIVE

## 2018-08-16 ENCOUNTER — Encounter: Payer: PRIVATE HEALTH INSURANCE | Admitting: Certified Nurse Midwife

## 2018-08-19 ENCOUNTER — Ambulatory Visit: Payer: PRIVATE HEALTH INSURANCE | Admitting: Family Medicine

## 2018-08-19 ENCOUNTER — Telehealth: Payer: Self-pay

## 2018-08-19 NOTE — Telephone Encounter (Signed)
Coronavirus (COVID-19) Are you at risk?  Are you at risk for the Coronavirus (COVID-19)?  To be considered HIGH RISK for Coronavirus (COVID-19), you have to meet the following criteria:  . Traveled to [place], [place], [place], [place] or [place]; or in the [place] to [place], [place], [place], or [place]; and have fever, cough, and shortness of breath within the last 2 weeks of travel OR . Been in close contact with a person diagnosed with COVID-19 within the last 2 weeks and have fever, cough, and shortness of breath . IF YOU DO NOT MEET THESE CRITERIA, YOU ARE CONSIDERED LOW RISK FOR COVID-19.  What to do if you are HIGH RISK for COVID-19?  . If you are having a medical emergency, call 911. . Seek medical care right away. Before you go to a doctor's office, urgent care or emergency department, call ahead and tell them about your recent travel, contact with someone diagnosed with COVID-19, and your symptoms. You should receive instructions from your physician's office regarding next steps of care.  . When you arrive at healthcare provider, tell the healthcare staff immediately you have returned from visiting [place], [place], [place], [place] or [place]; or traveled in the [place] to [place], [place], [place], or [place]; in the last two weeks or you have been in close contact with a person diagnosed with COVID-19 in the last 2 weeks.   . Tell the health care staff about your symptoms: fever, cough and shortness of breath. . After you have been seen by a medical provider, you will be either: o Tested for (COVID-19) and discharged home on quarantine except to seek medical care if symptoms worsen, and asked to  - Stay home and avoid contact with others until you get your results (4-5 days)  - Avoid travel on public transportation if possible (such as bus, train, or airplane) or o Sent to the Emergency Department by EMS for evaluation, COVID-19 testing, and possible  admission depending on your condition and test results.  What to do if you are LOW RISK for COVID-19?  Reduce your risk of any infection by using the same precautions used for avoiding the common cold or flu:  . Wash your hands often with soap and warm water for at least 20 seconds.  If soap and water are not readily available, use an alcohol-based hand sanitizer with at least 60% alcohol.  . If coughing or sneezing, cover your mouth and nose by coughing or sneezing into the elbow areas of your shirt or coat, into a tissue or into your sleeve (not your hands). . Avoid shaking hands with others and consider head nods or verbal greetings only. . Avoid touching your eyes, nose, or mouth with unwashed hands.  . Avoid close contact with people who are Yorley Buch. . Avoid places or events with large numbers of people in one location, like concerts or sporting events. . Carefully consider travel plans you have or are making. . If you are planning any travel outside or inside the [place], visit the CDC's Travelers' Health webpage for the latest health notices. . If you have some symptoms but not all symptoms, continue to monitor at home and seek medical attention if your symptoms worsen. . If you are having a medical emergency, call 911.  08/19/18 SCREENING [facility] ADDITIONAL HEALTHCARE OPTIONS FOR PATIENTS  [facility] / e-Visit: [email or web address]         [facility]: [phone]    [facility]: [phone]                   [facility]: [phone]  

## 2018-08-20 ENCOUNTER — Other Ambulatory Visit: Payer: Self-pay

## 2018-08-20 ENCOUNTER — Encounter: Payer: PRIVATE HEALTH INSURANCE | Admitting: Obstetrics and Gynecology

## 2018-08-20 ENCOUNTER — Ambulatory Visit (INDEPENDENT_AMBULATORY_CARE_PROVIDER_SITE_OTHER): Payer: PRIVATE HEALTH INSURANCE | Admitting: Obstetrics and Gynecology

## 2018-08-20 VITALS — BP 128/82 | HR 88 | Wt 179.4 lb

## 2018-08-20 DIAGNOSIS — Z3493 Encounter for supervision of normal pregnancy, unspecified, third trimester: Secondary | ICD-10-CM

## 2018-08-20 LAB — POCT URINALYSIS DIPSTICK OB
Bilirubin, UA: NEGATIVE
Blood, UA: NEGATIVE
Glucose, UA: NEGATIVE
Ketones, UA: NEGATIVE
Leukocytes, UA: NEGATIVE
Nitrite, UA: NEGATIVE
POC,PROTEIN,UA: NEGATIVE
Spec Grav, UA: 1.01 (ref 1.010–1.025)
Urobilinogen, UA: 0.2 E.U./dL
pH, UA: 6.5 (ref 5.0–8.0)

## 2018-08-20 NOTE — Progress Notes (Signed)
ROB- pt is having pelvic pressure, back pain

## 2018-08-20 NOTE — Progress Notes (Signed)
ROB- doing well, reports increased pressure.labor precautions discussed.

## 2018-08-27 ENCOUNTER — Telehealth: Payer: Self-pay | Admitting: Obstetrics and Gynecology

## 2018-08-27 NOTE — Telephone Encounter (Signed)
The patient called and stated that she is wanting to speak with a nurse sometime today to ask how often she should feel baby moving. Please advise.

## 2018-08-27 NOTE — Telephone Encounter (Signed)
Advised pt 2 x a day, when baby is most active, should get 10 movements, if nothing eat/drink, then repeat, pt voiced understanding

## 2018-09-01 ENCOUNTER — Observation Stay (INDEPENDENT_AMBULATORY_CARE_PROVIDER_SITE_OTHER)
Admission: EM | Admit: 2018-09-01 | Discharge: 2018-09-01 | Disposition: A | Discharge status: Home / Self Care | Payer: PRIVATE HEALTH INSURANCE | Source: Home / Self Care | Admitting: Obstetrics and Gynecology

## 2018-09-01 ENCOUNTER — Other Ambulatory Visit: Payer: Self-pay

## 2018-09-01 DIAGNOSIS — Z3A38 38 weeks gestation of pregnancy: Secondary | ICD-10-CM | POA: Insufficient documentation

## 2018-09-01 DIAGNOSIS — O471 False labor at or after 37 completed weeks of gestation: Secondary | ICD-10-CM | POA: Insufficient documentation

## 2018-09-01 HISTORY — DX: Depression, unspecified: F32.A

## 2018-09-01 MED ORDER — ACETAMINOPHEN 325 MG PO TABS: 650.0000 mg | ORAL_TABLET | ORAL | Status: DC | PRN

## 2018-09-01 NOTE — OB Triage Note (Signed)
   L&D OB Triage Note  SUBJECTIVE Suzanne Ross is a 28 y.o. G2P1001 female at [redacted]w[redacted]d, EDD Estimated Date of Delivery: 09/11/18 who presented to triage with complaints of contractions. She feels good movement and denies LOF and vaginal bleeding.   OB History  Gravida Para Term Preterm AB Living  2 1 1  0 0 1  SAB TAB Ectopic Multiple Live Births  0 0 0 0 1    # Outcome Date GA Lbr Len/2nd Weight Sex Delivery Anes PTL Lv  2 Current           1 Term 10/06/15 [redacted]w[redacted]d / 00:41 3020 g M Vag-Spont None  LIV     Name: Suzanne Ross     Apgar1: 8  Apgar5: 9    Medications Prior to Admission  Medication Sig Dispense Refill Last Dose  . acetaminophen (TYLENOL) 325 MG tablet Take 650 mg by mouth every 6 (six) hours as needed.   Past Week at Unknown time  . Prenatal Vit-Fe Fumarate-FA (PRENATAL 1+1 PO) Take 1 tablet by mouth daily.    08/31/2018 at Unknown time  . sertraline (ZOLOFT) 25 MG tablet Take 1 tablet (25 mg total) by mouth daily. 90 tablet 3 08/31/2018 at Unknown time     OBJECTIVE  Nursing Evaluation:   BP 115/68 (BP Location: Right Wrist)   Pulse 93   Temp 98.3 F (36.8 C) (Oral)   Resp 18   Wt 80.7 kg   BMI 29.62 kg/m    Findings:  False labor, contractions  NST was performed and has been reviewed by me.  NST INTERPRETATION: Category I  Mode: External Baseline Rate (A): 120 bpm(fht) Variability: Moderate Accelerations: 15 x 15 Decelerations: None     Contraction Frequency (min): 2-3  ASSESSMENT Impression:  1.  Pregnancy:  G2P1001 at [redacted]w[redacted]d , EDD Estimated Date of Delivery: 09/11/18 2.  NST:  Category I  3. No cervical change in 2 hrs.   PLAN 1. Reassurance given, reviewed signs and symptoms of labor 2. Discharge home with standard labor precautions given to return to L&D or call the office for problems. 3. Continue routine prenatal care.    Doreene Burke, CNM

## 2018-09-01 NOTE — OB Triage Note (Signed)
Pt presented to L/D with reported contractions since 1430. She rates the intermittent pain 7/10 in lower abdomen . No bleeding or LOF. Positive fetal movement. VSS. Will continue to monitor.

## 2018-09-02 ENCOUNTER — Encounter: Payer: Self-pay | Admitting: Anesthesiology

## 2018-09-02 ENCOUNTER — Inpatient Hospital Stay
Admission: EM | Admit: 2018-09-02 | Discharge: 2018-09-03 | DRG: 807 | Disposition: A | Discharge status: Home / Self Care | Payer: PRIVATE HEALTH INSURANCE | Attending: Certified Nurse Midwife | Admitting: Certified Nurse Midwife

## 2018-09-02 DIAGNOSIS — Z3A38 38 weeks gestation of pregnancy: Secondary | ICD-10-CM | POA: Diagnosis not present

## 2018-09-02 DIAGNOSIS — F329 Major depressive disorder, single episode, unspecified: Secondary | ICD-10-CM | POA: Diagnosis present

## 2018-09-02 DIAGNOSIS — O99344 Other mental disorders complicating childbirth: Secondary | ICD-10-CM | POA: Diagnosis present

## 2018-09-02 DIAGNOSIS — F419 Anxiety disorder, unspecified: Secondary | ICD-10-CM | POA: Diagnosis present

## 2018-09-02 DIAGNOSIS — O4202 Full-term premature rupture of membranes, onset of labor within 24 hours of rupture: Secondary | ICD-10-CM | POA: Diagnosis not present

## 2018-09-02 DIAGNOSIS — O26893 Other specified pregnancy related conditions, third trimester: Secondary | ICD-10-CM | POA: Diagnosis present

## 2018-09-02 LAB — CBC
HCT: 34.9 % — ABNORMAL LOW (ref 36.0–46.0)
Hemoglobin: 11.4 g/dL — ABNORMAL LOW (ref 12.0–15.0)
MCH: 29.4 pg (ref 26.0–34.0)
MCHC: 32.7 g/dL (ref 30.0–36.0)
MCV: 89.9 fL (ref 80.0–100.0)
Platelets: 269 10*3/uL (ref 150–400)
RBC: 3.88 MIL/uL (ref 3.87–5.11)
RDW: 14.1 % (ref 11.5–15.5)
WBC: 12.4 10*3/uL — ABNORMAL HIGH (ref 4.0–10.5)
nRBC: 0 % (ref 0.0–0.2)

## 2018-09-02 LAB — TYPE AND SCREEN
ABO/RH(D): O POS
Antibody Screen: NEGATIVE

## 2018-09-02 LAB — RUPTURE OF MEMBRANE (ROM)PLUS: Rom Plus: NEGATIVE

## 2018-09-02 MED ORDER — FERROUS SULFATE 325 (65 FE) MG PO TABS
325.0000 mg | ORAL_TABLET | Freq: Every day | ORAL | Status: DC
  Administered 2018-09-02 – 2018-09-03 (×2): 325 mg via ORAL
  Filled 2018-09-02 (×2): qty 1

## 2018-09-02 MED ORDER — LIDOCAINE HCL (PF) 1 % IJ SOLN
30.0000 mL | INTRAMUSCULAR | Status: AC | PRN
  Administered 2018-09-02: 30 mL via SUBCUTANEOUS
  Filled 2018-09-02: qty 30

## 2018-09-02 MED ORDER — LACTATED RINGERS IV SOLN: 500.0000 mL | INTRAVENOUS | Status: DC | PRN

## 2018-09-02 MED ORDER — SENNOSIDES-DOCUSATE SODIUM 8.6-50 MG PO TABS
2.0000 | ORAL_TABLET | ORAL | Status: DC
  Administered 2018-09-03: 2 via ORAL
  Filled 2018-09-02: qty 2

## 2018-09-02 MED ORDER — BUTORPHANOL TARTRATE 2 MG/ML IJ SOLN: 1.0000 mg | INTRAMUSCULAR | Status: DC | PRN

## 2018-09-02 MED ORDER — SERTRALINE HCL 25 MG PO TABS
25.0000 mg | ORAL_TABLET | Freq: Every day | ORAL | Status: DC
  Administered 2018-09-02: 25 mg via ORAL
  Filled 2018-09-02 (×2): qty 1

## 2018-09-02 MED ORDER — LACTATED RINGERS IV SOLN: INTRAVENOUS | Status: DC

## 2018-09-02 MED ORDER — OXYCODONE-ACETAMINOPHEN 5-325 MG PO TABS: 2.0000 | ORAL_TABLET | ORAL | Status: DC | PRN

## 2018-09-02 MED ORDER — ONDANSETRON HCL 4 MG PO TABS: 4.0000 mg | ORAL_TABLET | ORAL | Status: DC | PRN

## 2018-09-02 MED ORDER — SERTRALINE HCL 20 MG/ML PO CONC: 25.0000 mg | Freq: Every day | ORAL | Status: DC

## 2018-09-02 MED ORDER — METHYLERGONOVINE MALEATE 0.2 MG/ML IJ SOLN: 0.2000 mg | INTRAMUSCULAR | Status: DC | PRN

## 2018-09-02 MED ORDER — DIBUCAINE (PERIANAL) 1 % EX OINT: 1.0000 "application " | TOPICAL_OINTMENT | CUTANEOUS | Status: DC | PRN

## 2018-09-02 MED ORDER — COCONUT OIL OIL
1.0000 "application " | TOPICAL_OIL | Status: DC | PRN
  Administered 2018-09-02: 1 via TOPICAL
  Filled 2018-09-02: qty 120

## 2018-09-02 MED ORDER — MISOPROSTOL 200 MCG PO TABS
ORAL_TABLET | ORAL | Status: AC
  Filled 2018-09-02: qty 4

## 2018-09-02 MED ORDER — OXYTOCIN 10 UNIT/ML IJ SOLN
INTRAMUSCULAR | Status: AC
  Filled 2018-09-02: qty 2

## 2018-09-02 MED ORDER — BENZOCAINE-MENTHOL 20-0.5 % EX AERO
1.0000 "application " | INHALATION_SPRAY | CUTANEOUS | Status: DC | PRN
  Filled 2018-09-02: qty 56

## 2018-09-02 MED ORDER — SIMETHICONE 80 MG PO CHEW: 80.0000 mg | CHEWABLE_TABLET | ORAL | Status: DC | PRN

## 2018-09-02 MED ORDER — OXYTOCIN BOLUS FROM INFUSION
500.0000 mL | Freq: Once | INTRAVENOUS | Status: AC
  Administered 2018-09-02: 500 mL via INTRAVENOUS

## 2018-09-02 MED ORDER — ACETAMINOPHEN 325 MG PO TABS: 650.0000 mg | ORAL_TABLET | ORAL | Status: DC | PRN

## 2018-09-02 MED ORDER — FENTANYL 2.5 MCG/ML W/ROPIVACAINE 0.15% IN NS 100 ML EPIDURAL (ARMC)
EPIDURAL | Status: AC
  Filled 2018-09-02: qty 100

## 2018-09-02 MED ORDER — DOCUSATE SODIUM 100 MG PO CAPS
100.0000 mg | ORAL_CAPSULE | Freq: Two times a day (BID) | ORAL | Status: DC
  Administered 2018-09-02 – 2018-09-03 (×2): 100 mg via ORAL
  Filled 2018-09-02 (×2): qty 1

## 2018-09-02 MED ORDER — WITCH HAZEL-GLYCERIN EX PADS: 1.0000 "application " | MEDICATED_PAD | CUTANEOUS | Status: DC | PRN

## 2018-09-02 MED ORDER — AMMONIA AROMATIC IN INHA
RESPIRATORY_TRACT | Status: AC
  Filled 2018-09-02: qty 10

## 2018-09-02 MED ORDER — ACETAMINOPHEN 325 MG PO TABS
650.0000 mg | ORAL_TABLET | ORAL | Status: DC | PRN
  Administered 2018-09-02: 650 mg via ORAL
  Filled 2018-09-02: qty 2

## 2018-09-02 MED ORDER — OXYCODONE-ACETAMINOPHEN 5-325 MG PO TABS: 1.0000 | ORAL_TABLET | ORAL | Status: DC | PRN

## 2018-09-02 MED ORDER — METHYLERGONOVINE MALEATE 0.2 MG PO TABS: 0.2000 mg | ORAL_TABLET | ORAL | Status: DC | PRN

## 2018-09-02 MED ORDER — SOD CITRATE-CITRIC ACID 500-334 MG/5ML PO SOLN: 30.0000 mL | ORAL | Status: DC | PRN

## 2018-09-02 MED ORDER — ONDANSETRON HCL 4 MG/2ML IJ SOLN: 4.0000 mg | INTRAMUSCULAR | Status: DC | PRN

## 2018-09-02 MED ORDER — PRENATAL MULTIVITAMIN CH
1.0000 | ORAL_TABLET | Freq: Every day | ORAL | Status: DC
  Administered 2018-09-02: 1 via ORAL
  Filled 2018-09-02: qty 1

## 2018-09-02 MED ORDER — ONDANSETRON HCL 4 MG/2ML IJ SOLN: 4.0000 mg | Freq: Four times a day (QID) | INTRAMUSCULAR | Status: DC | PRN

## 2018-09-02 MED ORDER — IBUPROFEN 600 MG PO TABS
600.0000 mg | ORAL_TABLET | Freq: Four times a day (QID) | ORAL | Status: DC
  Administered 2018-09-02 – 2018-09-03 (×5): 600 mg via ORAL
  Filled 2018-09-02 (×5): qty 1

## 2018-09-02 MED ORDER — OXYTOCIN 40 UNITS IN NORMAL SALINE INFUSION - SIMPLE MED
2.5000 [IU]/h | INTRAVENOUS | Status: DC
  Filled 2018-09-02: qty 1000

## 2018-09-02 NOTE — OB Triage Note (Signed)
Pt presents to unit c/o of clear leakage of fluid that started at 0245 this am. Pt denies vaginal bleeding and reports positive fetal movement. Pt has continued to have contractions since last triage visit. Vital signs WNL. Will continue to monitor.

## 2018-09-02 NOTE — H&P (Signed)
History and Physical   HPI  Suzanne Ross is a 28 y.o. G2P1001 at [redacted]w[redacted]d Estimated Date of Delivery: 09/11/18 who is being admitted for labor management, SROM clear fluid.    OB History  OB History  Gravida Para Term Preterm AB Living  2 1 1  0 0 1  SAB TAB Ectopic Multiple Live Births  0 0 0 0 1    # Outcome Date GA Lbr Len/2nd Weight Sex Delivery Anes PTL Lv  2 Current           1 Term 10/06/15 [redacted]w[redacted]d / 00:41 3020 g M Vag-Spont None  LIV     Name: Turri,BOY Haadiya     Apgar1: 8  Apgar5: 9    PROBLEM LIST  Pregnancy complications or risks: Patient Active Problem List   Diagnosis Date Noted  . Labor and delivery, indication for care 09/01/2018  . Sprain of iliolumbar ligament 05/29/2018  . Anxiety 04/12/2018  . Depression affecting pregnancy 04/12/2018  . Supervision of other normal pregnancy, antepartum 04/12/2018  . Contusion of finger of left hand, initial encounter 08/07/2017  . Nonallopathic lesion of cervical region 05/31/2017  . Nonallopathic lesion of lumbar region 05/31/2017  . Slipped rib syndrome 12/14/2015  . Nonallopathic lesion of thoracic region 12/14/2015  . Nonallopathic lesion of rib cage 12/14/2015  . Susceptible to varicella (non-immune), currently pregnant 03/25/2015     Prenatal labs and studies: ABO, Rh: O/Positive/-- (11/13 0000) Antibody: Negative (11/13 0000) Rubella: Immune (11/13 0000) RPR: Non Reactive (02/25 0810)  HBsAg: Negative (11/13 0000)  HIV: non reactive (11/13 0000)  MPN:TIRWERXV (04/15 1545)   Past Medical History:  Diagnosis Date  . Broken neck (HCC)   . Depression   . Medical history non-contributory      Past Surgical History:  Procedure Laterality Date  . ANKLE FRACTURE SURGERY Left   . RHINOPLASTY    . TONSILLECTOMY    . WISDOM TOOTH EXTRACTION       Medications    Current Discharge Medication List    CONTINUE these medications which have NOT CHANGED   Details  acetaminophen (TYLENOL) 325 MG  tablet Take 650 mg by mouth every 6 (six) hours as needed.    Prenatal Vit-Fe Fumarate-FA (PRENATAL 1+1 PO) Take 1 tablet by mouth daily.     sertraline (ZOLOFT) 25 MG tablet Take 1 tablet (25 mg total) by mouth daily. Qty: 90 tablet, Refills: 3         Allergies  Patient has no known allergies.  Review of Systems  Constitutional: negative Eyes: negative Ears, nose, mouth, throat, and face: negative Respiratory: negative Cardiovascular: negative Gastrointestinal: negative Genitourinary:negative Integument/breast: negative Hematologic/lymphatic: negative Musculoskeletal:negative Neurological: negative Behavioral/Psych: negative Endocrine: negative Allergic/Immunologic: negative  Physical Exam  BP (!) 95/55   Pulse 90   Temp 98.2 F (36.8 C) (Oral)   Resp 18   Ht 5\' 5"  (1.651 m)   Wt 79.4 kg   BMI 29.12 kg/m   Lungs:  CTA B Cardio: RRR  Abd: Soft, gravid, NT Presentation: cephalic EXT: No C/C/ 1+ Edema DTRs: 2+ B CERVIX: Dilation: 9 Effacement (%): 90 Cervical Position: Middle Station: -1 Presentation: Vertex Exam by:: A. Janee Morn CNM    See Prenatal records for more detailed PE.     FHR:  Baseline: 125 bpm, Variability: Good {> 6 bpm), Accelerations: Reactive and Decelerations: Absent  Toco: Uterine Contractions: Frequency: Every 2-3 minutes, Duration: 70-90 seconds and Intensity: strong    Test Results  Results for orders  placed or performed during the hospital encounter of 09/02/18 (from the past 24 hour(s))  ROM Plus ([facility] only)     Status: None   Collection Time: 09/02/18  3:33 AM  Result Value Ref Range   Rom Plus NEGATIVE   CBC     Status: Abnormal   Collection Time: 09/02/18  3:33 AM  Result Value Ref Range   WBC 12.4 (H) 4.0 - 10.5 K/uL   RBC 3.88 3.87 - 5.11 MIL/uL   Hemoglobin 11.4 (L) 12.0 - 15.0 g/dL   HCT 98.134.9 (L) 19.136.0 - 47.846.0 %   MCV 89.9 80.0 - 100.0 fL   MCH 29.4 26.0 - 34.0 pg   MCHC 32.7 30.0 - 36.0 g/dL   RDW 29.514.1  62.111.5 - 30.815.5 %   Platelets 269 150 - 400 K/uL   nRBC 0.0 0.0 - 0.2 %   Group B Strep negative  Assessment   G2P1001 at 7261w5d Estimated Date of Delivery: 09/11/18  The fetus is reassuring.    Patient Active Problem List   Diagnosis Date Noted  . Labor and delivery, indication for care 09/01/2018  . Sprain of iliolumbar ligament 05/29/2018  . Anxiety 04/12/2018  . Depression affecting pregnancy 04/12/2018  . Supervision of other normal pregnancy, antepartum 04/12/2018  . Contusion of finger of left hand, initial encounter 08/07/2017  . Nonallopathic lesion of cervical region 05/31/2017  . Nonallopathic lesion of lumbar region 05/31/2017  . Slipped rib syndrome 12/14/2015  . Nonallopathic lesion of thoracic region 12/14/2015  . Nonallopathic lesion of rib cage 12/14/2015  . Susceptible to varicella (non-immune), currently pregnant 03/25/2015    Plan  1. Admit to L&D :   continue present management 2. EFM:-- Category 1 3. Epidural if desired.  Stadol for IV pain until epidural requested. 4. Admission labs  5.Aniticpate NSVD  Doreene Burkennie Juli Odom, CNM  09/02/2018 5:09 AM

## 2018-09-02 NOTE — Lactation Note (Addendum)
This note was copied from a baby's chart. Lactation Consultation Note  Patient Name: Suzanne Ross Date: 09/02/2018   Observed mom breast feeding latching [name] independently.  Can easily hand express lots of colostrum with mom reporting that she has been leaking for a while.  [name] has strong, rhythmic suck with gulping heard.  Mom denies any nipple or breast pain.  When she came off the right breast after 15 to 20 minutes of sucking, a cracked area was noted.  When she came off the left breast after same amount of time, it was pinched and also had slight positional stripe.  Mom reports dry nipples in last few weeks.  Coconut oil given with instructions in use.  Demonstrated hand expression of colostrum to rub on nipples to prevent bacteria, to lubricate and help with healing and discomfort.  Explained how to make sure [name] is getting a deep latch and to break suction if she started feeling pinching and/or pain.  Mom reports having the same problem in the beginning with the first baby who is now 59 years old of cracked, bleeding nipples but went on to breast feed her for 15 months.  Reviewed supply and demand, normal course of lactation and routine newborn feeding pattern.  Lactation name and number written on white board and encouraged to call with any questions, concerns or assistance.  Maternal Data    Feeding Feeding Type: Breast Fed  LATCH Score                   Interventions    Lactation Tools Discussed/Used     Consult Status      Louis Meckel 09/02/2018, 5:13 PM

## 2018-09-02 NOTE — Anesthesia Preprocedure Evaluation (Deleted)
Anesthesia Evaluation  Patient identified by MRN, date of birth, ID band Patient awake    Reviewed: Allergy & Precautions, NPO status , Patient's Chart, lab work & pertinent test results  Airway Mallampati: II  TM Distance: >3 FB     Dental  (+) Teeth Intact   Pulmonary neg pulmonary ROS,    Pulmonary exam normal        Cardiovascular negative cardio ROS Normal cardiovascular exam     Neuro/Psych Anxiety Depression negative neurological ROS     GI/Hepatic negative GI ROS, Neg liver ROS,   Endo/Other  negative endocrine ROS  Renal/GU negative Renal ROS  negative genitourinary   Musculoskeletal negative musculoskeletal ROS (+)   Abdominal Normal abdominal exam  (+)   Peds negative pediatric ROS (+)  Hematology negative hematology ROS (+)   Anesthesia Other Findings   Reproductive/Obstetrics (+) Pregnancy                             Anesthesia Physical Anesthesia Plan  ASA: II  Anesthesia Plan: Epidural   Post-op Pain Management:    Induction:   PONV Risk Score and Plan:   Airway Management Planned: Natural Airway  Additional Equipment:   Intra-op Plan:   Post-operative Plan:   Informed Consent: I have reviewed the patients History and Physical, chart, labs and discussed the procedure including the risks, benefits and alternatives for the proposed anesthesia with the patient or authorized representative who has indicated his/her understanding and acceptance.     Dental advisory given  Plan Discussed with: CRNA and Surgeon  Anesthesia Plan Comments:         Anesthesia Quick Evaluation

## 2018-09-03 ENCOUNTER — Other Ambulatory Visit: Payer: Self-pay

## 2018-09-03 LAB — CBC
HCT: 31.1 % — ABNORMAL LOW (ref 36.0–46.0)
Hemoglobin: 10 g/dL — ABNORMAL LOW (ref 12.0–15.0)
MCH: 29.5 pg (ref 26.0–34.0)
MCHC: 32.2 g/dL (ref 30.0–36.0)
MCV: 91.7 fL (ref 80.0–100.0)
Platelets: 211 10*3/uL (ref 150–400)
RBC: 3.39 MIL/uL — ABNORMAL LOW (ref 3.87–5.11)
RDW: 14.2 % (ref 11.5–15.5)
WBC: 12.5 10*3/uL — ABNORMAL HIGH (ref 4.0–10.5)
nRBC: 0 % (ref 0.0–0.2)

## 2018-09-03 LAB — RPR: RPR Ser Ql: NONREACTIVE

## 2018-09-03 MED ORDER — DOCUSATE SODIUM 100 MG PO CAPS: 100.0000 mg | ORAL_CAPSULE | Freq: Two times a day (BID) | ORAL | 2 refills | Status: DC | PRN

## 2018-09-03 MED ORDER — NORETHINDRONE 0.35 MG PO TABS: 1.0000 | ORAL_TABLET | Freq: Every day | ORAL | 11 refills | Status: DC

## 2018-09-03 NOTE — Discharge Summary (Signed)
Physician Obstetric Discharge Summary  Patient ID: Suzanne Ross MRN: [id number] DOB/AGE: 19-Jun-1990 28 y.o.   Date of Admission: 09/02/2018  Date of Discharge:   Admitting Diagnosis: Onset of Labor at [redacted]w[redacted]d  Secondary Diagnosis: depression  Mode of Delivery: normal spontaneous vaginal delivery     Discharge Diagnosis: normal SVD   Intrapartum Procedures: none   Post partum procedures: none  Complications: 1st degree perineal laceration   Brief Hospital Course  Suzanne Ross is a [id number] who had a SVD on 09/02/2018;  for further details of this, please refer to the delivey note.  Patient had an uncomplicated postpartum course.  By time of discharge on PPD#1, her pain was controlled on oral pain medications; she had appropriate lochia and was ambulating, voiding without difficulty and tolerating regular diet. She does has a small bruise on right nipple from nursing infant. She was deemed stable for discharge to home.        Labs: CBC Latest Ref Rng & Units 09/03/2018 09/02/2018 06/18/2018  WBC 4.0 - 10.5 K/uL 12.5(H) 12.4(H) 10.5  Hemoglobin 12.0 - 15.0 g/dL 10.0(L) 11.4(L) 11.3  Hematocrit 36.0 - 46.0 % 31.1(L) 34.9(L) 34.6  Platelets 150 - 400 K/uL 211 269 271   O POS  Physical exam:  Blood pressure 104/68, pulse 61, temperature 98 F (36.7 C), temperature source Oral, resp. rate 18, height 5\' 5"  (1.651 m), weight 79.4 kg, SpO2 100 %, unknown if currently breastfeeding. General: alert and no distress Lochia: appropriate Abdomen: soft, NT Uterine Fundus: firm Extremities: No evidence of DVT seen on physical exam. No lower extremity edema.  Discharge Instructions: Per After Visit Summary. Activity: Advance as tolerated. Pelvic rest for 6 weeks.  Also refer to After Visit Summary Diet: Regular Medications: Allergies as of 09/03/2018   No Known Allergies     Medication List    STOP taking these medications   acetaminophen 325 MG tablet Commonly known as:   TYLENOL     TAKE these medications   norethindrone 0.35 MG tablet Commonly known as:  MICRONOR Take 1 tablet (0.35 mg total) by mouth daily.   PRENATAL 1+1 PO Take 1 tablet by mouth daily.   sertraline 25 MG tablet Commonly known as:  Zoloft Take 1 tablet (25 mg total) by mouth daily.      Outpatient follow up:  Postpartum contraception: oral progesterone-only contraceptive, vasectomy  Discharged Condition: good  Discharged to: home   Newborn Data: Disposition:home with mother  Apgars: APGAR (1 MIN): 8   APGAR (5 MINS): 9   APGAR (10 MINS):    Baby Feeding: Breast  Suzanne Ross, CNM

## 2018-09-03 NOTE — Clinical Social Work Note (Signed)
CSW consulted due to history of depression through pregnancy. Patient's midwife informed nursing today that she has prescribed patient an anti depressant and has been following her throughout. She states patient is doing well at this time and she will continue to follow. CSW consult has been discontinued. York Spaniel MSW,LCSW [phone]

## 2018-09-03 NOTE — Progress Notes (Addendum)
Consulted social work about patient's history of antenatal/postpartum depression. Reported to RN that patient had suicidal ideations during pregnancy. Patient states she has had "bad depression" with both pregnancies. Edinburgh postnatal depression scale score-7. CNM states she has been following patient and has prescribed zoloft. CNM also states that she spoke with patient about following up with depression signs and symptoms and that medicine would be adjusted as needed. CSW updated.

## 2018-09-03 NOTE — Progress Notes (Signed)
Discharge instructions given. Patient verbalizes understanding of teaching. Patient discharged home via wheelchair at 1215. 

## 2018-09-04 ENCOUNTER — Encounter: Payer: PRIVATE HEALTH INSURANCE | Admitting: Obstetrics and Gynecology

## 2018-09-24 ENCOUNTER — Other Ambulatory Visit: Payer: Self-pay

## 2018-09-24 ENCOUNTER — Ambulatory Visit (INDEPENDENT_AMBULATORY_CARE_PROVIDER_SITE_OTHER): Payer: PRIVATE HEALTH INSURANCE | Admitting: Family Medicine

## 2018-09-24 ENCOUNTER — Encounter: Payer: Self-pay | Admitting: Family Medicine

## 2018-09-24 VITALS — BP 110/70 | HR 73 | Ht 65.0 in

## 2018-09-24 DIAGNOSIS — M999 Biomechanical lesion, unspecified: Secondary | ICD-10-CM | POA: Diagnosis not present

## 2018-09-24 DIAGNOSIS — M940 Chondrocostal junction syndrome [Tietze]: Secondary | ICD-10-CM | POA: Diagnosis not present

## 2018-09-24 NOTE — Assessment & Plan Note (Signed)
Decision today to treat with OMT was based on Physical Exam  After verbal consent patient was treated with HVLA, ME, FPR techniques in cervical, thoracic, rib lumbar and sacral areas  Patient tolerated the procedure well with improvement in symptoms  Patient given exercises, stretches and lifestyle modifications  See medications in patient instructions if given  Patient will follow up in 3-4 weeks 

## 2018-09-24 NOTE — Assessment & Plan Note (Signed)
Continues to have some difficulty.  I do believe that the elevation in the progesterone with breast-feeding and now postpartum is likely can contribute to some of the discomfort and pain.  Gust with patient again at great length.  Discussed icing regimen and home exercise.  Discussed which activities to do which wants to avoid.  Patient is to increase activity slowly.  Patient does respond well to manipulation and will follow-up again in 3 to 4 weeks

## 2018-09-24 NOTE — Progress Notes (Signed)
[facility] [address] [place], [place] [address] Phone: [phone] Subjective:   Bruce Donath, am serving as a scribe for Dr. Antoine Primas.  I'm seeing this patient by the request  of:    CC: Back and neck pain follow-up  DPO:[id number]  Suzanne Ross is a 28 y.o. female coming in with complaint of back and cervical spine pain. States that her left hip is also bothering her. Does have numbness and tingling in left leg when lying supine.  Patient is 1 month postpartum.  Has had more discomfort recently.  Mostly seems to be more on the upper back.  No radiation activity to the extremities.  States it is just painful and he finds it very uncomfortable even at night.    Past Medical History:  Diagnosis Date  . Broken neck (HCC)   . Depression   . Medical history non-contributory    Past Surgical History:  Procedure Laterality Date  . ANKLE FRACTURE SURGERY Left   . RHINOPLASTY    . TONSILLECTOMY    . WISDOM TOOTH EXTRACTION     Social History   Socioeconomic History  . Marital status: Married    Spouse name: Jimmey Ralph  . Number of children: Not on file  . Years of education: Not on file  . Highest education level: Not on file  Occupational History  . Not on file  Social Needs  . Financial resource strain: Not hard at all  . Food insecurity:    Worry: Never true    Inability: Never true  . Transportation needs:    Medical: No    Non-medical: No  Tobacco Use  . Smoking status: Never Smoker  . Smokeless tobacco: Never Used  Substance and Sexual Activity  . Alcohol use: No    Alcohol/week: 0.0 standard drinks  . Drug use: No  . Sexual activity: Yes    Partners: Male    Birth control/protection: None    Comment: vasectomy  Lifestyle  . Physical activity:    Days per week: 5 days    Minutes per session: 30 min  . Stress: Rather much  Relationships  . Social connections:    Talks on phone: More than three times a week    Gets  together: More than three times a week    Attends religious service: More than 4 times per year    Active member of club or organization: No    Attends meetings of clubs or organizations: Never    Relationship status: Married  Other Topics Concern  . Not on file  Social History Narrative  . Not on file   No Known Allergies Family History  Problem Relation Age of Onset  . Hypertension Maternal Grandmother   . Diabetes Other        fob's-father    Current Outpatient Medications (Endocrine & Metabolic):  .  norethindrone (MICRONOR) 0.35 MG tablet, Take 1 tablet (0.35 mg total) by mouth daily.      Current Outpatient Medications (Other):  .  docusate sodium (COLACE) 100 MG capsule, Take 1 capsule (100 mg total) by mouth 2 (two) times daily as needed. .  Prenatal Vit-Fe Fumarate-FA (PRENATAL 1+1 PO), Take 1 tablet by mouth daily.  .  sertraline (ZOLOFT) 25 MG tablet, Take 1 tablet (25 mg total) by mouth daily.    Past medical history, social, surgical and family history all reviewed in electronic medical record.  No pertanent information unless stated regarding  to the chief complaint.   Review of Systems:  No headache, visual changes, nausea, vomiting, diarrhea, constipation, dizziness, abdominal pain, skin rash, fevers, chills, night sweats, weight loss, swollen lymph nodes, body aches, joint swelling, muscle aches, chest pain, shortness of breath, mood changes.   Objective  unknown if currently breastfeeding. Systems examined below as of    General: No apparent distress alert and oriented x3 mood and affect normal, dressed appropriately.  HEENT: Pupils equal, extraocular movements intact  Respiratory: Patient's speak in full sentences and does not appear short of breath  Cardiovascular: No lower extremity edema, non tender, no erythema  Skin: Warm dry intact with no signs of infection or rash on extremities or on axial skeleton.  Abdomen: Soft nontender  Neuro: Cranial  nerves II through XII are intact, neurovascularly intact in all extremities with 2+ DTRs and 2+ pulses.  Lymph: No lymphadenopathy of posterior or anterior cervical chain or axillae bilaterally.  Gait normal with good balance and coordination.  MSK:  Non tender with full range of motion and good stability and symmetric strength and tone of shoulders, elbows, wrist, hip, knee and ankles bilaterally.  Neck: Inspection mild loss of lordosis. No palpable stepoffs. Negative Spurling's maneuver. Mild limited range of motion lacking last 10 degrees of sidebending bilaterally.  Tightness of the trapezius bilaterally Grip strength and sensation normal in bilateral hands Strength good C4 to T1 distribution No sensory change to C4 to T1 Negative Hoffman sign bilaterally Reflexes normal  Osteopathic findings  C6 flexed rotated and side bent left T3 extended rotated and side bent left inhaled third rib T9 extended rotated and side bent left L1 flexed rotated and side bent right Sacrum right on right    Impression and Recommendations:     This case required medical decision making of moderate complexity. The above documentation has been reviewed and is accurate and complete Judi SaaZachary M Jaykub Mackins, DO       Note: This dictation was prepared with Dragon dictation along with smaller phrase technology. Any transcriptional errors that result from this process are unintentional.

## 2018-10-16 ENCOUNTER — Other Ambulatory Visit: Payer: Self-pay

## 2018-10-16 ENCOUNTER — Encounter: Payer: Self-pay | Admitting: Family Medicine

## 2018-10-16 ENCOUNTER — Ambulatory Visit (INDEPENDENT_AMBULATORY_CARE_PROVIDER_SITE_OTHER): Payer: PRIVATE HEALTH INSURANCE | Admitting: Family Medicine

## 2018-10-16 VITALS — BP 102/66 | HR 80 | Ht 65.0 in | Wt 165.0 lb

## 2018-10-16 DIAGNOSIS — M940 Chondrocostal junction syndrome [Tietze]: Secondary | ICD-10-CM | POA: Diagnosis not present

## 2018-10-16 DIAGNOSIS — M999 Biomechanical lesion, unspecified: Secondary | ICD-10-CM

## 2018-10-16 NOTE — Assessment & Plan Note (Signed)
Difficulty with this in the past.  Discussed posture and ergonomics.  We discussed core stability.  Discussed which activities to do which wants to avoid.  Patient will increase activity slowly.  Follow-up again in 4 to 8 weeks

## 2018-10-16 NOTE — Progress Notes (Signed)
[facility] [address] [place], [place] [address] Phone: [phone] Subjective:   Bruce Donath, Valerie Wolf, am serving as a scribe for Dr. Antoine PrimasZachary Janautica Netzley.  I'm seeing this patient by the request  of:    CC: Low back pain follow-up  BJY:[id number]:Subjective  Carlena Hurlicole Slaymaker is a [age] y.o. female coming in with complaint of back pain. Has felt an increase in her pain. Did have a 2 back spasms the other day. Feels that her posture is causing her pain as she is leaning forward taking care of her children.  Patient has had very mild difficulties.  Doing much better at the moment.  Describes pain as a dull, throbbing aching sensation.     Past Medical History:  Diagnosis Date  . Broken neck (HCC)   . Depression   . Medical history non-contributory    Past Surgical History:  Procedure Laterality Date  . ANKLE FRACTURE SURGERY Left   . RHINOPLASTY    . TONSILLECTOMY    . WISDOM TOOTH EXTRACTION     Social History   Socioeconomic History  . Marital status: Married    Spouse name: Jimmey Ralpharker  . Number of children: Not on file  . Years of education: Not on file  . Highest education level: Not on file  Occupational History  . Not on file  Social Needs  . Financial resource strain: Not hard at all  . Food insecurity    Worry: Never true    Inability: Never true  . Transportation needs    Medical: No    Non-medical: No  Tobacco Use  . Smoking status: Never Smoker  . Smokeless tobacco: Never Used  Substance and Sexual Activity  . Alcohol use: No    Alcohol/week: 0.0 standard drinks  . Drug use: No  . Sexual activity: Yes    Partners: Male    Birth control/protection: None    Comment: vasectomy  Lifestyle  . Physical activity    Days per week: 5 days    Minutes per session: 30 min  . Stress: Rather much  Relationships  . Social connections    Talks on phone: More than three times a week    Gets together: More than three times a week    Attends religious  service: More than 4 times per year    Active member of club or organization: No    Attends meetings of clubs or organizations: Never    Relationship status: Married  Other Topics Concern  . Not on file  Social History Narrative  . Not on file   No Known Allergies Family History  Problem Relation Age of Onset  . Hypertension Maternal Grandmother   . Diabetes Other        fob's-father    Current Outpatient Medications (Endocrine & Metabolic):  .  norethindrone (MICRONOR) 0.35 MG tablet, Take 1 tablet (0.35 mg total) by mouth daily.      Current Outpatient Medications (Other):  .  docusate sodium (COLACE) 100 MG capsule, Take 1 capsule (100 mg total) by mouth 2 (two) times daily as needed. .  Prenatal Vit-Fe Fumarate-FA (PRENATAL 1+1 PO), Take 1 tablet by mouth daily.  .  sertraline (ZOLOFT) 25 MG tablet, Take 1 tablet (25 mg total) by mouth daily.    Past medical history, social, surgical and family history all reviewed in electronic medical record.  No pertanent information unless stated regarding to the chief complaint.   Review of Systems:  No headache, visual changes, nausea, vomiting, diarrhea, constipation, dizziness, abdominal pain, skin rash, fevers, chills, night sweats, weight loss, swollen lymph nodes, body aches, joint swelling, , chest pain, shortness of breath, mood changes.  Mild positive muscle aches  Objective  Blood pressure 102/66, pulse 80, height 5\' 5"  (1.651 m), weight 165 lb (74.8 kg), SpO2 98 %, unknown if currently breastfeeding.     General: No apparent distress alert and oriented x3 mood and affect normal, dressed appropriately.  HEENT: Pupils equal, extraocular movements intact  Respiratory: Patient's speak in full sentences and does not appear short of breath  Cardiovascular: No lower extremity edema, non tender, no erythema  Skin: Warm dry intact with no signs of infection or rash on extremities or on axial skeleton.  Abdomen: Soft nontender   Neuro: Cranial nerves II through XII are intact, neurovascularly intact in all extremities with 2+ DTRs and 2+ pulses.  Lymph: No lymphadenopathy of posterior or anterior cervical chain or axillae bilaterally.  Gait normal with good balance and coordination.  MSK:  Non tender with full range of motion and good stability and symmetric strength and tone of shoulders, elbows, wrist, hip, knee and ankles bilaterally.  Back Exam:  Inspection: Very mild loss of lordosis Motion: Flexion 45 deg, Extension 25 deg, Side Bending to 35 deg bilaterally,  Rotation to 45 deg bilaterally  SLR laying: Negative  XSLR laying: Negative  Palpable tenderness: Mild tenderness to palpation paraspinal musculature in the lumbar spine right greater than left. FABER: negative. Sensory change: Gross sensation intact to all lumbar and sacral dermatomes.  Reflexes: 2+ at both patellar tendons, 2+ at achilles tendons, Babinski's downgoing.  Strength at foot  Plantar-flexion: 5/5 Dorsi-flexion: 5/5 Eversion: 5/5 Inversion: 5/5  Leg strength  Quad: 5/5 Hamstring: 5/5 Hip flexor: 5/5 Hip abductors: 5/5  Gait unremarkable.  Osteopathic findings  C2 flexed rotated and side bent right C6 flexed rotated and side bent left T6 extended rotated and side bent right inhaled rib T9 extended rotated and side bent left L2 flexed rotated and side bent right Sacrum right on right    Impression and Recommendations:     This case required medical decision making of moderate complexity. The above documentation has been reviewed and is accurate and complete Lyndal Pulley, DO       Note: This dictation was prepared with Dragon dictation along with smaller phrase technology. Any transcriptional errors that result from this process are unintentional.

## 2018-10-16 NOTE — Assessment & Plan Note (Signed)
Decision today to treat with OMT was based on Physical Exam  After verbal consent patient was treated with HVLA, ME, FPR techniques in cervical, thoracic, rib lumbar and sacral areas  Patient tolerated the procedure well with improvement in symptoms  Patient given exercises, stretches and lifestyle modifications  See medications in patient instructions if given  Patient will follow up in 4-8 weeks 

## 2018-10-17 ENCOUNTER — Ambulatory Visit (INDEPENDENT_AMBULATORY_CARE_PROVIDER_SITE_OTHER): Payer: PRIVATE HEALTH INSURANCE | Admitting: Obstetrics and Gynecology

## 2018-10-17 ENCOUNTER — Other Ambulatory Visit: Payer: Self-pay

## 2018-10-17 ENCOUNTER — Encounter: Payer: Self-pay | Admitting: Obstetrics and Gynecology

## 2018-10-17 NOTE — Progress Notes (Signed)
  Subjective:     Suzanne Ross is a 28 y.o. female who presents for a postpartum visit. She is 6 weeks postpartum following a spontaneous vaginal delivery. I have fully reviewed the prenatal and intrapartum course. The delivery was at 38.5 gestational weeks. Outcome: spontaneous vaginal delivery. Anesthesia: local. Postpartum course has been uncomplicated. Baby's course has been uncomplicated. Baby is feeding by breast. Bleeding no bleeding. Bowel function is normal. Bladder function is normal. Patient is not sexually active. Contraception method is abstinence. Postpartum depression screening: negative. Depression screen [facility] 2/9 10/17/2018 08/07/2018 07/18/2018 04/12/2018 11/18/2015  Decreased Interest 0 0 3 3 0  Down, Depressed, Hopeless 0 1 2 3 1   PHQ - 2 Score 0 1 5 6 1   Altered sleeping 0 0 2 3 2   Tired, decreased energy 0 1 3 1 2   Change in appetite 0 0 2 0 1  Feeling bad or failure about yourself  0 0 2 3 1   Trouble concentrating 0 1 1 0 0  Moving slowly or fidgety/restless 0 0 0 0 0  Suicidal thoughts 0 0 1 1 1   PHQ-9 Score 0 3 16 14 8   Difficult doing work/chores Not difficult at all Not difficult at all Somewhat difficult Not difficult at all -   The following portions of the patient's history were reviewed and updated as appropriate: allergies, current medications, past family history, past medical history, past social history, past surgical history and problem list.  Review of Systems A comprehensive review of systems was negative.   Objective:    BP 98/68   Pulse 92   Ht 5\' 5"  (1.651 m)   Wt 165 lb 4.8 oz (75 kg)   LMP 10/09/2018   Breastfeeding Yes   BMI 27.51 kg/m   General:  alert, cooperative and appears stated age   Breasts:  inspection negative, no nipple discharge or bleeding, no masses or nodularity palpable  Lungs: clear to auscultation bilaterally  Heart:  regular rate and rhythm, S1, S2 normal, no murmur, click, rub or gallop  Abdomen: soft, non-tender; bowel  sounds normal; no masses,  no organomegaly   Vulva:  normal  Vagina: normal vagina, no discharge, exudate, lesion, or erythema  Cervix:  multiparous appearance  Corpus: normal size, contour, position, consistency, mobility, non-tender  Adnexa:  no mass, fullness, tenderness  Rectal Exam: Not performed.        Assessment:     6 weeks postpartum exam. Pap smear not done at today's visit.   Plan:    1. Contraception: oral progesterone-only contraceptive 2. Labs obtained-will follow up accordingly 3. Follow up in: 3 months or as needed.

## 2018-10-18 LAB — CBC
Hematocrit: 37.9 % (ref 34.0–46.6)
Hemoglobin: 12.6 g/dL (ref 11.1–15.9)
MCH: 28.9 pg (ref 26.6–33.0)
MCHC: 33.2 g/dL (ref 31.5–35.7)
MCV: 87 fL (ref 79–97)
Platelets: 283 10*3/uL (ref 150–450)
RBC: 4.36 x10E6/uL (ref 3.77–5.28)
RDW: 13.9 % (ref 11.7–15.4)
WBC: 6.9 10*3/uL (ref 3.4–10.8)

## 2018-10-18 LAB — FERRITIN: Ferritin: 54 ng/mL (ref 15–150)

## 2018-10-18 LAB — VITAMIN D 25 HYDROXY (VIT D DEFICIENCY, FRACTURES): Vit D, 25-Hydroxy: 47.2 ng/mL (ref 30.0–100.0)

## 2018-11-12 ENCOUNTER — Telehealth: Payer: Self-pay

## 2018-11-12 NOTE — Telephone Encounter (Signed)
Coronavirus (COVID-19) Are you at risk?  Are you at risk for the Coronavirus (COVID-19)?  To be considered HIGH RISK for Coronavirus (COVID-19), you have to meet the following criteria:  . Traveled to [place], [place], [place], [place] or [place]; or in the [place] to [place], [place], [place], or [place]; and have fever, cough, and shortness of breath within the last 2 weeks of travel OR . Been in close contact with a person diagnosed with COVID-19 within the last 2 weeks and have fever, cough, and shortness of breath . IF YOU DO NOT MEET THESE CRITERIA, YOU ARE CONSIDERED LOW RISK FOR COVID-19.  What to do if you are HIGH RISK for COVID-19?  . If you are having a medical emergency, call 911. . Seek medical care right away. Before you go to a doctor's office, urgent care or emergency department, call ahead and tell them about your recent travel, contact with someone diagnosed with COVID-19, and your symptoms. You should receive instructions from your physician's office regarding next steps of care.  . When you arrive at healthcare provider, tell the healthcare staff immediately you have returned from visiting [place], [place], [place], [place] or [place]; or traveled in the [place] to [place], [place], [place], or [place]; in the last two weeks or you have been in close contact with a person diagnosed with COVID-19 in the last 2 weeks.   . Tell the health care staff about your symptoms: fever, cough and shortness of breath. . After you have been seen by a medical provider, you will be either: o Tested for (COVID-19) and discharged home on quarantine except to seek medical care if symptoms worsen, and asked to  - Stay home and avoid contact with others until you get your results (4-5 days)  - Avoid travel on public transportation if possible (such as bus, train, or airplane) or o Sent to the Emergency Department by EMS for evaluation, COVID-19 testing, and possible  admission depending on your condition and test results.  What to do if you are LOW RISK for COVID-19?  Reduce your risk of any infection by using the same precautions used for avoiding the common cold or flu:  . Wash your hands often with soap and warm water for at least 20 seconds.  If soap and water are not readily available, use an alcohol-based hand sanitizer with at least 60% alcohol.  . If coughing or sneezing, cover your mouth and nose by coughing or sneezing into the elbow areas of your shirt or coat, into a tissue or into your sleeve (not your hands). . Avoid shaking hands with others and consider head nods or verbal greetings only. . Avoid touching your eyes, nose, or mouth with unwashed hands.  . Avoid close contact with people who are Suzanne Ross. . Avoid places or events with large numbers of people in one location, like concerts or sporting events. . Carefully consider travel plans you have or are making. . If you are planning any travel outside or inside the [place], visit the CDC's Travelers' Health webpage for the latest health notices. . If you have some symptoms but not all symptoms, continue to monitor at home and seek medical attention if your symptoms worsen. . If you are having a medical emergency, call 911.  11/12/18 SCREENING NEG SLS ADDITIONAL HEALTHCARE OPTIONS FOR PATIENTS  [facility] / e-Visit: [email or web address]         [facility]: [phone]    [facility]: [phone]                   [facility]: [phone]  

## 2018-11-13 ENCOUNTER — Other Ambulatory Visit: Payer: Self-pay

## 2018-11-13 ENCOUNTER — Encounter: Payer: Self-pay | Admitting: Certified Nurse Midwife

## 2018-11-13 ENCOUNTER — Ambulatory Visit (INDEPENDENT_AMBULATORY_CARE_PROVIDER_SITE_OTHER): Payer: PRIVATE HEALTH INSURANCE | Admitting: Certified Nurse Midwife

## 2018-11-13 VITALS — BP 106/62 | HR 79 | Ht 65.0 in | Wt 168.1 lb

## 2018-11-13 DIAGNOSIS — Z30430 Encounter for insertion of intrauterine contraceptive device: Secondary | ICD-10-CM

## 2018-11-13 DIAGNOSIS — Z3202 Encounter for pregnancy test, result negative: Secondary | ICD-10-CM | POA: Diagnosis not present

## 2018-11-13 LAB — POCT URINE PREGNANCY: Preg Test, Ur: NEGATIVE

## 2018-11-13 NOTE — Progress Notes (Signed)
    GYNECOLOGY OFFICE PROCEDURE NOTE  Madell Heino is a 28 y.o. [phone] here for Mirena IUD insertion. No GYN concerns.  Last pap smear was on 03/06/18 and was normal.  IUD Insertion Procedure Note Patient identified, informed consent performed, consent signed.   Discussed risks of irregular bleeding, cramping, infection, malpositioning or misplacement of the IUD outside the uterus which may require further procedure such as laparoscopy. Also discussed >99% contraception efficacy, increased risk of ectopic pregnancy with failure of method.  Time out was performed.  Urine pregnancy test negative.  Speculum placed in the vagina.  Cervix visualized.  Cleaned with Betadine x 2.  Grasped anteriorly with a single tooth tenaculum.  Uterus sounded to 7.5 cm.  Mirena IUD placed per manufacturer's recommendations.  Strings trimmed to 3 cm. Tenaculum was removed, small amount of bleeding, siliver nitrate stick used- good hemostasis noted.  Patient tolerated procedure well.   Patient was given post-procedure instructions.  She was advised to have backup contraception for one week.  Patient was also asked to check IUD strings periodically and follow up in 4 weeks for IUD check.  Philip Aspen, CNM

## 2018-11-13 NOTE — Patient Instructions (Signed)
Intrauterine Device Insertion, Care After  This sheet gives you information about how to care for yourself after your procedure. Your health care provider may also give you more specific instructions. If you have problems or questions, contact your health care provider. What can I expect after the procedure? After the procedure, it is common to have:  Cramps and pain in the abdomen.  Light bleeding (spotting) or heavier bleeding that is like your menstrual period. This may last for up to a few days.  Lower back pain.  Dizziness.  Headaches.  Nausea. Follow these instructions at home:  Before resuming sexual activity, check to make sure that you can feel the IUD string(s). You should be able to feel the end of the string(s) below the opening of your cervix. If your IUD string is in place, you may resume sexual activity. ? If you had a hormonal IUD inserted more than 7 days after your most recent period started, you will need to use a backup method of birth control for 7 days after IUD insertion. Ask your health care provider whether this applies to you.  Continue to check that the IUD is still in place by feeling for the string(s) after every menstrual period, or once a month.  Take over-the-counter and prescription medicines only as told by your health care provider.  Do not drive or use heavy machinery while taking prescription pain medicine.  Keep all follow-up visits as told by your health care provider. This is important. Contact a health care provider if:  You have bleeding that is heavier or lasts longer than a normal menstrual cycle.  You have a fever.  You have cramps or abdominal pain that get worse or do not get better with medicine.  You develop abdominal pain that is new or is not in the same area of earlier cramping and pain.  You feel lightheaded or weak.  You have abnormal or bad-smelling discharge from your vagina.  You have pain during sexual activity.   You have any of the following problems with your IUD string(s): ? The string bothers or hurts you or your sexual partner. ? You cannot feel the string. ? The string has gotten longer.  You can feel the IUD in your vagina.  You think you may be pregnant, or you miss your menstrual period.  You think you may have an STI (sexually transmitted infection). Get help right away if:  You have flu-like symptoms.  You have a fever and chills.  You can feel that your IUD has slipped out of place. Summary  After the procedure, it is common to have cramps and pain in the abdomen. It is also common to have light bleeding (spotting) or heavier bleeding that is like your menstrual period.  Continue to check that the IUD is still in place by feeling for the string(s) after every menstrual period, or once a month.  Keep all follow-up visits as told by your health care provider. This is important.  Contact your health care provider if you have problems with your IUD string(s), such as the string getting longer or bothering you or your sexual partner. This information is not intended to replace advice given to you by your health care provider. Make sure you discuss any questions you have with your health care provider. Document Released: 12/07/2010 Document Revised: 03/23/2017 Document Reviewed: 03/01/2016 Elsevier Patient Education  2020 [employer]  

## 2018-11-20 ENCOUNTER — Other Ambulatory Visit: Payer: Self-pay

## 2018-11-20 ENCOUNTER — Encounter: Payer: Self-pay | Admitting: Family Medicine

## 2018-11-20 ENCOUNTER — Ambulatory Visit (INDEPENDENT_AMBULATORY_CARE_PROVIDER_SITE_OTHER): Payer: PRIVATE HEALTH INSURANCE | Admitting: Family Medicine

## 2018-11-20 VITALS — BP 100/68 | HR 80 | Ht 65.0 in | Wt 169.0 lb

## 2018-11-20 DIAGNOSIS — M999 Biomechanical lesion, unspecified: Secondary | ICD-10-CM | POA: Diagnosis not present

## 2018-11-20 DIAGNOSIS — M940 Chondrocostal junction syndrome [Tietze]: Secondary | ICD-10-CM | POA: Diagnosis not present

## 2018-11-20 DIAGNOSIS — M9981 Other biomechanical lesions of cervical region: Secondary | ICD-10-CM

## 2018-11-20 NOTE — Assessment & Plan Note (Signed)
1.  Discussed posture and ergonomics.  Discussed which activities of doing which wants to avoid.  Patient is to increase activity slowly over the course the next several days.  Follow-up again in 4 to 8 weeks

## 2018-11-20 NOTE — Progress Notes (Signed)
Tawana ScaleZach Smith D.O. [facility] [address] [place], [place] [address] Phone: [phone] Subjective:   I Suzanne NighKana Amiera Ross am serving as a Neurosurgeonscribe for Dr. Antoine PrimasZachary Smith.  I'm seeing this patient by the request  of:    CC:   BMW:[id number]:Subjective  Suzanne Ross is a 28 y.o. female coming in with complaint of back pain. States she is feeling ok. Believes she injured it this past weekend. Upper back pain.   Onset-  Location Duration-  Character- Aggravating factors- Reliving factors-  Therapies tried-  Severity-     Past Medical History:  Diagnosis Date  . Broken neck ([facility])   . Depression   . Medical history non-contributory    Past Surgical History:  Procedure Laterality Date  . ANKLE FRACTURE SURGERY Left   . RHINOPLASTY    . TONSILLECTOMY    . WISDOM TOOTH EXTRACTION     Social History   Socioeconomic History  . Marital status: Married    Spouse name: Jimmey Ralpharker  . Number of children: Not on file  . Years of education: Not on file  . Highest education level: Not on file  Occupational History  . Not on file  Social Needs  . Financial resource strain: Not hard at all  . Food insecurity    Worry: Never true    Inability: Never true  . Transportation needs    Medical: No    Non-medical: No  Tobacco Use  . Smoking status: Never Smoker  . Smokeless tobacco: Never Used  Substance and Sexual Activity  . Alcohol use: No    Alcohol/week: 0.0 standard drinks  . Drug use: No  . Sexual activity: Not Currently    Partners: Male    Birth control/protection: None    Comment: vasectomy  Lifestyle  . Physical activity    Days per week: 5 days    Minutes per session: 30 min  . Stress: Rather much  Relationships  . Social connections    Talks on phone: More than three times a week    Gets together: More than three times a week    Attends religious service: More than 4 times per year    Active member of club or organization: No    Attends meetings of clubs or  organizations: Never    Relationship status: Married  Other Topics Concern  . Not on file  Social History Narrative  . Not on file   No Known Allergies Family History  Problem Relation Age of Onset  . Hypertension Maternal Grandmother   . Diabetes Other        fob's-father    Current Outpatient Medications (Endocrine & Metabolic):  .  norethindrone (MICRONOR) 0.35 MG tablet, Take 1 tablet (0.35 mg total) by mouth daily.      Current Outpatient Medications (Other):  .  docusate sodium (COLACE) 100 MG capsule, Take 1 capsule (100 mg total) by mouth 2 (two) times daily as needed. .  Prenatal Vit-Fe Fumarate-FA (PRENATAL 1+1 PO), Take 1 tablet by mouth daily.  .  sertraline (ZOLOFT) 25 MG tablet, Take 1 tablet (25 mg total) by mouth daily.    Past medical history, social, surgical and family history all reviewed in electronic medical record.  No pertanent information unless stated regarding to the chief complaint.   Review of Systems:  No headache, visual changes, nausea, vomiting, diarrhea, constipation, dizziness, abdominal pain, skin rash, fevers, chills, night sweats, weight loss, swollen lymph nodes, body aches, joint swelling, muscle  aches, chest pain, shortness of breath, mood changes.   Objective  Last menstrual period 11/07/2018, currently breastfeeding. Systems examined below as of    General: No apparent distress alert and oriented x3 mood and affect normal, dressed appropriately.  HEENT: Pupils equal, extraocular movements intact  Respiratory: Patient's speak in full sentences and does not appear short of breath  Cardiovascular: No lower extremity edema, non tender, no erythema  Skin: Warm dry intact with no signs of infection or rash on extremities or on axial skeleton.  Abdomen: Soft nontender  Neuro: Cranial nerves II through XII are intact, neurovascularly intact in all extremities with 2+ DTRs and 2+ pulses.  Lymph: No lymphadenopathy of posterior or anterior  cervical chain or axillae bilaterally.  Gait normal with good balance and coordination.  MSK:  Non tender with full range of motion and good stability and symmetric strength and tone of shoulders, elbows, wrist, hip, knee and ankles bilaterally.     Impression and Recommendations:     This case required medical decision making of moderate complexity. The above documentation has been reviewed and is accurate and complete Rockne Dearinger [place]       Note: This dictation was prepared with Dragon dictation along with smaller phrase technology. Any transcriptional errors that result from this process are unintentional.

## 2018-11-20 NOTE — Assessment & Plan Note (Signed)
Decision today to treat with OMT was based on Physical Exam  After verbal consent patient was treated with HVLA, ME, FPR techniques in cervical, thoracic, lumbar and sacral areas  Patient tolerated the procedure well with improvement in symptoms  Patient given exercises, stretches and lifestyle modifications  See medications in patient instructions if given  Patient will follow up in 4-8 weeks 

## 2018-11-20 NOTE — Progress Notes (Signed)
[facility] [place], [place] [address] Phone: [phone] Subjective:   I, Suzanne Ross, am serving as a scribe for Dr. Hulan Saas.  I'm seeing this patient by the request  of:    CC: Back and neck Ross follow-up  [id number]  Suzanne Ross is a 28 y.o. female coming in with complaint of back and neck Ross.  Has been doing relatively well.  Has had more stiffness between the shoulder blades.  Has been sleeping with her child who unfortunately has been having nightmares.  Feels about is contributing.  Patient has been working out on a fairly regular basis though.  Feels like she is making progress.  Feels healthier overall.     Past Medical History:  Diagnosis Date  . Broken neck ([facility])   . Depression   . Medical history non-contributory    Past Surgical History:  Procedure Laterality Date  . ANKLE FRACTURE SURGERY Left   . RHINOPLASTY    . TONSILLECTOMY    . WISDOM TOOTH EXTRACTION     Social History   Socioeconomic History  . Marital status: Married    Spouse name: Suzanne Ross  . Number of children: Not on file  . Years of education: Not on file  . Highest education level: Not on file  Occupational History  . Not on file  Social Needs  . Financial resource strain: Not hard at all  . Food insecurity    Worry: Never true    Inability: Never true  . Transportation needs    Medical: No    Non-medical: No  Tobacco Use  . Smoking status: Never Smoker  . Smokeless tobacco: Never Used  Substance and Sexual Activity  . Alcohol use: No    Alcohol/week: 0.0 standard drinks  . Drug use: No  . Sexual activity: Not Currently    Partners: Male    Birth control/protection: None    Comment: vasectomy  Lifestyle  . Physical activity    Days per week: 5 days    Minutes per session: 30 min  . Stress: Rather much  Relationships  . Social connections    Talks on phone: More than three times a week    Gets together: More than  three times a week    Attends religious service: More than 4 times per year    Active member of club or organization: No    Attends meetings of clubs or organizations: Never    Relationship status: Married  Other Topics Concern  . Not on file  Social History Narrative  . Not on file   No Known Allergies Family History  Problem Relation Age of Onset  . Hypertension Maternal Grandmother   . Diabetes Other        fob's-father    Current Outpatient Medications (Endocrine & Metabolic):  .  norethindrone (MICRONOR) 0.35 MG tablet, Take 1 tablet (0.35 mg total) by mouth daily.      Current Outpatient Medications (Other):  .  docusate sodium (COLACE) 100 MG capsule, Take 1 capsule (100 mg total) by mouth 2 (two) times daily as needed. .  Prenatal Vit-Fe Fumarate-FA (PRENATAL 1+1 PO), Take 1 tablet by mouth daily.  .  sertraline (ZOLOFT) 25 MG tablet, Take 1 tablet (25 mg total) by mouth daily.    Past medical history, social, surgical and family history all reviewed in electronic medical record.  No pertanent information unless stated regarding to the chief complaint.   Review  of Systems:  No headache, visual changes, nausea, vomiting, diarrhea, constipation, dizziness, abdominal Ross, skin rash, fevers, chills, night sweats, weight loss, swollen lymph nodes, body aches, joint swelling, muscle aches, chest Ross, shortness of breath, mood changes.   Objective  Blood pressure 100/68, pulse 80, height 5\' 5"  (1.651 m), weight 169 lb (76.7 kg), last menstrual period 11/07/2018, SpO2 94 %, currently breastfeeding. Systems examined below as of    General: No apparent distress alert and oriented x3 mood and affect normal, dressed appropriately.  HEENT: Pupils equal, extraocular movements intact  Respiratory: Patient's speak in full sentences and does not appear short of breath  Cardiovascular: No lower extremity edema, non tender, no erythema  Skin: Warm dry intact with no signs of  infection or rash on extremities or on axial skeleton.  Abdomen: Soft nontender  Neuro: Cranial nerves II through XII are intact, neurovascularly intact in all extremities with 2+ DTRs and 2+ pulses.  Lymph: No lymphadenopathy of posterior or anterior cervical chain or axillae bilaterally.  Gait normal with good balance and coordination.  MSK:  Non tender with full range of motion and good stability and symmetric strength and tone of shoulders, elbows, wrist, hip, knee and ankles bilaterally.  Patient leg exam shows some mild loss of lordosis.  Patient does have some tenderness between the parascapular region right greater than left.  Slipped rib is noted on the right side again.  No weakness of the upper extremities.  Low back exam has some tightness noted in the paraspinal musculature.  Mild tightness with Suzanne BrownieFaber testing negative straight leg test.  Patient has near 5 out of 5 strength.  Neurovascular intact distally with good deep tendon reflexes.  Osteopathic findings C4 flexed rotated and side bent left C6 flexed rotated and side bent left T3 extended rotated and side bent right inhaled third rib T6 extended rotated and side bent left L2 flexed rotated and side bent right Sacrum right on right    Impression and Recommendations:     This case required medical decision making of moderate complexity. The above documentation has been reviewed and is accurate and complete Suzanne SaaZachary M Aleenah Homen, DO       Note: This dictation was prepared with Dragon dictation along with smaller phrase technology. Any transcriptional errors that result from this process are unintentional.

## 2018-12-06 ENCOUNTER — Encounter: Payer: Self-pay | Admitting: Obstetrics and Gynecology

## 2018-12-06 ENCOUNTER — Other Ambulatory Visit: Payer: Self-pay

## 2018-12-06 ENCOUNTER — Ambulatory Visit (INDEPENDENT_AMBULATORY_CARE_PROVIDER_SITE_OTHER): Payer: PRIVATE HEALTH INSURANCE | Admitting: Obstetrics and Gynecology

## 2018-12-06 VITALS — BP 119/69 | HR 78 | Ht 65.0 in | Wt 157.2 lb

## 2018-12-06 DIAGNOSIS — Z975 Presence of (intrauterine) contraceptive device: Secondary | ICD-10-CM | POA: Diagnosis not present

## 2018-12-06 DIAGNOSIS — N921 Excessive and frequent menstruation with irregular cycle: Secondary | ICD-10-CM

## 2018-12-06 DIAGNOSIS — Z30432 Encounter for removal of intrauterine contraceptive device: Secondary | ICD-10-CM

## 2018-12-06 NOTE — Progress Notes (Signed)
Suzanne Ross is a 28 y.o. year old [id number] Caucasian female who presents for removal of a Mirena IUD due to pain with sex and continued spotting. Spouse had vasectomy 1 month ago. Her Mirena IUD was placed 09/2018.   Patient's last menstrual period was 11/07/2018 (exact date). BP 119/69   Pulse 78   Ht 5\' 5"  (1.651 m)   Wt 157 lb 3.2 oz (71.3 kg)   LMP 11/07/2018 (Exact Date)   Breastfeeding Yes   BMI 26.16 kg/m   Time out was performed.  A graves speculum was placed in the vagina.  The cervix was visualized, and the strings were visible. They were grasped and the Mirena was easily removed intact without complications.   F/U next week at planned annual exam.  [name] Rockney Ghee, CNM

## 2018-12-11 ENCOUNTER — Encounter: Payer: Self-pay | Admitting: Family Medicine

## 2018-12-12 ENCOUNTER — Other Ambulatory Visit: Payer: Self-pay

## 2018-12-12 ENCOUNTER — Encounter: Payer: Self-pay | Admitting: Family Medicine

## 2018-12-12 ENCOUNTER — Ambulatory Visit (INDEPENDENT_AMBULATORY_CARE_PROVIDER_SITE_OTHER): Payer: PRIVATE HEALTH INSURANCE | Admitting: Family Medicine

## 2018-12-12 VITALS — BP 100/60 | HR 81 | Ht 65.0 in

## 2018-12-12 DIAGNOSIS — M9981 Other biomechanical lesions of cervical region: Secondary | ICD-10-CM

## 2018-12-12 DIAGNOSIS — M999 Biomechanical lesion, unspecified: Secondary | ICD-10-CM

## 2018-12-12 DIAGNOSIS — S338XXD Sprain of other parts of lumbar spine and pelvis, subsequent encounter: Secondary | ICD-10-CM | POA: Diagnosis not present

## 2018-12-12 NOTE — Assessment & Plan Note (Signed)
Mild exacerbation.  Pain is worse with extension.  Discussed home exercises and icing regimen.  Responded well to osteopathic manipulation, patient will increase activity slowly.  Follow-up again in 4 to 8 weeks.

## 2018-12-12 NOTE — Patient Instructions (Signed)
Prilosec daily for 2 weeks Tums with meals See me again in 3-4 weeks

## 2018-12-12 NOTE — Progress Notes (Signed)
[name] [place], [place] [address] Phone: [phone] Subjective:   I [name] am serving as a Education administrator for Dr. Hulan Saas.  I'm seeing this patient by the request  of:    CC: Low back pain  [id number]  Camil Hausmann is a 28 y.o. female coming in with complaint of hip pain. Believes she slept on it wrong. Pain radiating up the back not far from her hip.  Onset- 2 days ago Location - posterior  Character- sore, feels like it wants to pop  Aggravating factors- Reliving factors-  Therapies tried- tylenol  Severity-8 out of 10.  Feels similar to when she is wearing the iliolumbar ligament but not quite as severe.     Past Medical History:  Diagnosis Date  . Broken neck ([facility])   . Depression   . Medical history non-contributory    Past Surgical History:  Procedure Laterality Date  . ANKLE FRACTURE SURGERY Left   . RHINOPLASTY    . TONSILLECTOMY    . WISDOM TOOTH EXTRACTION     Social History   Socioeconomic History  . Marital status: Married    Spouse name: [name]  . Number of children: Not on file  . Years of education: Not on file  . Highest education level: Not on file  Occupational History  . Not on file  Social Needs  . Financial resource strain: Not hard at all  . Food insecurity    Worry: Never true    Inability: Never true  . Transportation needs    Medical: No    Non-medical: No  Tobacco Use  . Smoking status: Never Smoker  . Smokeless tobacco: Never Used  Substance and Sexual Activity  . Alcohol use: No    Alcohol/week: 0.0 standard drinks  . Drug use: No  . Sexual activity: Yes    Partners: Male    Birth control/protection: None, I.U.D.    Comment: vasectomy  Lifestyle  . Physical activity    Days per week: 5 days    Minutes per session: 30 min  . Stress: Rather much  Relationships  . Social connections    Talks on phone: More than three times a week    Gets together: More than  three times a week    Attends religious service: More than 4 times per year    Active member of club or organization: No    Attends meetings of clubs or organizations: Never    Relationship status: Married  Other Topics Concern  . Not on file  Social History Narrative  . Not on file   No Known Allergies Family History  Problem Relation Age of Onset  . Hypertension Maternal Grandmother   . Diabetes Other        fob's-father         Current Outpatient Medications (Other):  Marland Kitchen  Prenatal Vit-Fe Fumarate-FA (PRENATAL 1+1 PO), Take 1 tablet by mouth daily.  .  sertraline (ZOLOFT) 25 MG tablet, Take 1 tablet (25 mg total) by mouth daily.    Past medical history, social, surgical and family history all reviewed in electronic medical record.  No pertanent information unless stated regarding to the chief complaint.   Review of Systems:  No headache, visual changes, nausea, vomiting, diarrhea, constipation, dizziness, abdominal pain, skin rash, fevers, chills, night sweats, weight loss, swollen lymph nodes, body aches, joint swelling, muscle aches, chest pain, shortness of breath, mood changes.   Objective  Blood pressure 100/60, pulse 81, height 5\' 5"  (1.651 m), SpO2 98 %, currently breastfeeding. Systems examined below as of    General: No apparent distress alert and oriented x3 mood and affect normal, dressed appropriately.  HEENT: Pupils equal, extraocular movements intact  Respiratory: Patient's speak in full sentences and does not appear short of breath  Cardiovascular: No lower extremity edema, non tender, no erythema  Skin: Warm dry intact with no signs of infection or rash on extremities or on axial skeleton.  Abdomen: Soft nontender  Neuro: Cranial nerves II through XII are intact, neurovascularly intact in all extremities with 2+ DTRs and 2+ pulses.  Lymph: No lymphadenopathy of posterior or anterior cervical chain or axillae bilaterally.  Gait normal with good balance  and coordination.  MSK:  Non tender with full range of motion and good stability and symmetric strength and tone of shoulders, elbows, wrist, hip, knee and ankles bilaterally.   Exam shows the patient does have some decrease in extension secondary to pain.  Full flexion noted.  Tender to palpation diffusely in the paraspinal musculature lumbar spine left greater than right positive Pearlean BrownieFaber on the left side.  Osteopathic findings C6 flexed rotated and side bent left T3 extended rotated and side bent right inhaled third rib T5 extended rotated and side bent left L3 flexed rotated and side bent right Sacrum left on left    Impression and Recommendations:     This case required medical decision making of moderate complexity. The above documentation has been reviewed and is accurate and complete Judi SaaZachary M Smith, DO       Note: This dictation was prepared with Dragon dictation along with smaller phrase technology. Any transcriptional errors that result from this process are unintentional.

## 2018-12-12 NOTE — Assessment & Plan Note (Signed)
Decision today to treat with OMT was based on Physical Exam  After verbal consent patient was treated with HVLA, ME, FPR techniques in cervical, thoracic, rib,  lumbar and sacral areas  Patient tolerated the procedure well with improvement in symptoms  Patient given exercises, stretches and lifestyle modifications  See medications in patient instructions if given  Patient will follow up in 4-8 weeks 

## 2018-12-13 ENCOUNTER — Encounter: Payer: PRIVATE HEALTH INSURANCE | Admitting: Certified Nurse Midwife

## 2018-12-17 ENCOUNTER — Encounter: Payer: Self-pay | Admitting: Obstetrics and Gynecology

## 2018-12-17 ENCOUNTER — Other Ambulatory Visit: Payer: Self-pay

## 2018-12-17 ENCOUNTER — Ambulatory Visit (INDEPENDENT_AMBULATORY_CARE_PROVIDER_SITE_OTHER): Payer: PRIVATE HEALTH INSURANCE | Admitting: Obstetrics and Gynecology

## 2018-12-17 VITALS — BP 100/81 | HR 71 | Ht 65.0 in | Wt 164.2 lb

## 2018-12-17 DIAGNOSIS — Z01419 Encounter for gynecological examination (general) (routine) without abnormal findings: Secondary | ICD-10-CM | POA: Diagnosis not present

## 2018-12-17 MED ORDER — SERTRALINE HCL 25 MG PO TABS: 75.0000 mg | ORAL_TABLET | Freq: Every day | ORAL | 1 refills | Status: AC

## 2018-12-17 NOTE — Patient Instructions (Signed)
 Preventive Care 21-26 Years Old, Female Preventive care refers to visits with your health care provider and lifestyle choices that can promote health and wellness. This includes:  A yearly physical exam. This may also be called an annual well check.  Regular dental visits and eye exams.  Immunizations.  Screening for certain conditions.  Healthy lifestyle choices, such as eating a healthy diet, getting regular exercise, not using drugs or products that contain nicotine and tobacco, and limiting alcohol use. What can I expect for my preventive care visit? Physical exam Your health care provider will check your:  Height and weight. This may be used to calculate body mass index (BMI), which tells if you are at a healthy weight.  Heart rate and blood pressure.  Skin for abnormal spots. Counseling Your health care provider may ask you questions about your:  Alcohol, tobacco, and drug use.  Emotional well-being.  Home and relationship well-being.  Sexual activity.  Eating habits.  Work and work environment.  Method of birth control.  Menstrual cycle.  Pregnancy history. What immunizations do I need?  Influenza (flu) vaccine  This is recommended every year. Tetanus, diphtheria, and pertussis (Tdap) vaccine  You may need a Td booster every 10 years. Varicella (chickenpox) vaccine  You may need this if you have not been vaccinated. Human papillomavirus (HPV) vaccine  If recommended by your health care provider, you may need three doses over 6 months. Measles, mumps, and rubella (MMR) vaccine  You may need at least one dose of MMR. You may also need a second dose. Meningococcal conjugate (MenACWY) vaccine  One dose is recommended if you are age 19-21 years and a first-year college student living in a residence hall, or if you have one of several medical conditions. You may also need additional booster doses. Pneumococcal conjugate (PCV13) vaccine  You may need  this if you have certain conditions and were not previously vaccinated. Pneumococcal polysaccharide (PPSV23) vaccine  You may need one or two doses if you smoke cigarettes or if you have certain conditions. Hepatitis A vaccine  You may need this if you have certain conditions or if you travel or work in places where you may be exposed to hepatitis A. Hepatitis B vaccine  You may need this if you have certain conditions or if you travel or work in places where you may be exposed to hepatitis B. Haemophilus influenzae type b (Hib) vaccine  You may need this if you have certain conditions. You may receive vaccines as individual doses or as more than one vaccine together in one shot (combination vaccines). Talk with your health care provider about the risks and benefits of combination vaccines. What tests do I need?  Blood tests  Lipid and cholesterol levels. These may be checked every 5 years starting at age 20.  Hepatitis C test.  Hepatitis B test. Screening  Diabetes screening. This is done by checking your blood sugar (glucose) after you have not eaten for a while (fasting).  Sexually transmitted disease (STD) testing.  BRCA-related cancer screening. This may be done if you have a family history of breast, ovarian, tubal, or peritoneal cancers.  Pelvic exam and Pap test. This may be done every 3 years starting at age 21. Starting at age 30, this may be done every 5 years if you have a Pap test in combination with an HPV test. Talk with your health care provider about your test results, treatment options, and if necessary, the need for more   tests. Follow these instructions at home: Eating and drinking   Eat a diet that includes fresh fruits and vegetables, whole grains, lean protein, and low-fat dairy.  Take vitamin and mineral supplements as recommended by your health care provider.  Do not drink alcohol if: ? Your health care provider tells you not to drink. ? You are  pregnant, may be pregnant, or are planning to become pregnant.  If you drink alcohol: ? Limit how much you have to 0-1 drink a day. ? Be aware of how much alcohol is in your drink. In the [place] one drink equals one 12 oz bottle of beer (355 mL), one 5 oz glass of wine (148 mL), or one 1 oz glass of hard liquor (44 mL). Lifestyle  Take daily care of your teeth and gums.  Stay active. Exercise for at least 30 minutes on 5 or more days each week.  Do not use any products that contain nicotine or tobacco, such as cigarettes, e-cigarettes, and chewing tobacco. If you need help quitting, ask your health care provider.  If you are sexually active, practice safe sex. Use a condom or other form of birth control (contraception) in order to prevent pregnancy and STIs (sexually transmitted infections). If you plan to become pregnant, see your health care provider for a preconception visit. What's next?  Visit your health care provider once a year for a well check visit.  Ask your health care provider how often you should have your eyes and teeth checked.  Stay up to date on all vaccines. This information is not intended to replace advice given to you by your health care provider. Make sure you discuss any questions you have with your health care provider. Document Released: 06/06/2001 Document Revised: 12/20/2017 Document Reviewed: 12/20/2017 Elsevier Patient Education  2020 [employer].

## 2018-12-17 NOTE — Progress Notes (Signed)
  Subjective:     Suzanne Ross is a 28 y.o. female and is here for a comprehensive physical exam. The patient reports no problems.  Social History   Socioeconomic History  . Marital status: Married    Spouse name: [name]  . Number of children: Not on file  . Years of education: Not on file  . Highest education level: Not on file  Occupational History  . Not on file  Social Needs  . Financial resource strain: Not hard at all  . Food insecurity    Worry: Never true    Inability: Never true  . Transportation needs    Medical: No    Non-medical: No  Tobacco Use  . Smoking status: Never Smoker  . Smokeless tobacco: Never Used  Substance and Sexual Activity  . Alcohol use: No    Alcohol/week: 0.0 standard drinks  . Drug use: No  . Sexual activity: Yes    Partners: Male    Birth control/protection: None, I.U.D.    Comment: vasectomy  Lifestyle  . Physical activity    Days per week: 5 days    Minutes per session: 30 min  . Stress: Rather much  Relationships  . Social connections    Talks on phone: More than three times a week    Gets together: More than three times a week    Attends religious service: More than 4 times per year    Active member of club or organization: No    Attends meetings of clubs or organizations: Never    Relationship status: Married  . Intimate partner violence    Fear of current or ex partner: No    Emotionally abused: No    Physically abused: No    Forced sexual activity: No  Other Topics Concern  . Not on file  Social History Narrative  . Not on file   Health Maintenance  Topic Date Due  . INFLUENZA VACCINE  11/23/2018  . PAP SMEAR-Modifier  03/06/2021  . PAP-Cervical Cytology Screening  03/25/2021  . TETANUS/TDAP  06/18/2028  . HIV Screening  Completed    The following portions of the patient's history were reviewed and updated as appropriate: allergies, current medications, past family history, past medical history, past social  history, past surgical history and problem list.  Review of Systems Pertinent items are noted in HPI.   Objective:    There were no vitals taken for this visit. General appearance: alert, cooperative and appears stated age Neck: no adenopathy, no carotid bruit, no JVD, supple, symmetrical, trachea midline and thyroid not enlarged, symmetric, no tenderness/mass/nodules Lungs: clear to auscultation bilaterally Breasts: normal appearance, no masses or tenderness Heart: regular rate and rhythm, S1, S2 normal, no murmur, click, rub or gallop Abdomen: soft, non-tender; bowel sounds normal; no masses,  no organomegaly Pelvic: cervix normal in appearance, external genitalia normal, no adnexal masses or tenderness, no cervical motion tenderness, rectovaginal septum normal, uterus normal size, shape, and consistency and vagina normal without discharge    Assessment:    Healthy female exam. Lactational amenorrhea. PPD     Plan:  Doing well on current dose of zoloft, [place] out [place] the [place]and needs a years prescription. RTC in 1 year or as needed.   Rebecca Motta,CNM   See After Visit Summary for Counseling Recommendations

## 2018-12-20 ENCOUNTER — Encounter: Payer: Self-pay | Admitting: Family Medicine

## 2018-12-25 ENCOUNTER — Ambulatory Visit: Payer: PRIVATE HEALTH INSURANCE | Admitting: Family Medicine

## 2019-01-02 NOTE — Progress Notes (Signed)
Tawana ScaleZach Damyn Weitzel D.O. [facility] [address] [place], [place] [address] Phone: [phone] Subjective:   I Ronelle NighKana Thompson am serving as a Neurosurgeonscribe for Dr. Antoine PrimasZachary Rohan Juenger.   CC: Back pain follow-up  WGN:[id number]:Subjective  Suzanne Ross is a 28 y.o. female coming in with complaint of back pain. Last seen on 12/12/2018 for OMT. Patient states she has been having a lot of numbness and tingling that goes up her back or down her leg. Hurts at night with sleeping with laying on her back and sides. Laying on her stomach creates numbness.  Patient states the leg is intermittent, never stops her from exercising or activities.  Patient is a lot of stress with moving soon.      Past Medical History:  Diagnosis Date  . Broken neck (HCC)   . Depression   . Medical history non-contributory    Past Surgical History:  Procedure Laterality Date  . ANKLE FRACTURE SURGERY Left   . RHINOPLASTY    . TONSILLECTOMY    . WISDOM TOOTH EXTRACTION     Social History   Socioeconomic History  . Marital status: Married    Spouse name: Jimmey Ralpharker  . Number of children: Not on file  . Years of education: Not on file  . Highest education level: Not on file  Occupational History  . Not on file  Social Needs  . Financial resource strain: Not hard at all  . Food insecurity    Worry: Never true    Inability: Never true  . Transportation needs    Medical: No    Non-medical: No  Tobacco Use  . Smoking status: Never Smoker  . Smokeless tobacco: Never Used  Substance and Sexual Activity  . Alcohol use: No    Alcohol/week: 0.0 standard drinks  . Drug use: No  . Sexual activity: Yes    Partners: Male    Birth control/protection: None    Comment: vasectomy  Lifestyle  . Physical activity    Days per week: 5 days    Minutes per session: 30 min  . Stress: Rather much  Relationships  . Social connections    Talks on phone: More than three times a week    Gets together: More than three times a week   Attends religious service: More than 4 times per year    Active member of club or organization: No    Attends meetings of clubs or organizations: Never    Relationship status: Married  Other Topics Concern  . Not on file  Social History Narrative  . Not on file   No Known Allergies Family History  Problem Relation Age of Onset  . Hypertension Maternal Grandmother   . Diabetes Other        fob's-father         Current Outpatient Medications (Other):  Marland Kitchen.  Prenatal Vit-Fe Fumarate-FA (PRENATAL 1+1 PO), Take 1 tablet by mouth daily.  .  sertraline (ZOLOFT) 25 MG tablet, Take 3 tablets (75 mg total) by mouth daily.    Past medical history, social, surgical and family history all reviewed in electronic medical record.  No pertanent information unless stated regarding to the chief complaint.   Review of Systems:  No headache, visual changes, nausea, vomiting, diarrhea, constipation, dizziness, abdominal pain, skin rash, fevers, chills, night sweats, weight loss, swollen lymph nodes, body aches, joint swelling, muscle aches, chest pain, shortness of breath, mood changes.   Objective  currently breastfeeding. Systems examined below as of  General: No apparent distress alert and oriented x3 mood and affect normal, dressed appropriately.  HEENT: Pupils equal, extraocular movements intact  Respiratory: Patient's speak in full sentences and does not appear short of breath  Cardiovascular: No lower extremity edema, non tender, no erythema  Skin: Warm dry intact with no signs of infection or rash on extremities or on axial skeleton.  Abdomen: Soft nontender  Neuro: Cranial nerves II through XII are intact, neurovascularly intact in all extremities with 2+ DTRs and 2+ pulses.  Lymph: No lymphadenopathy of posterior or anterior cervical chain or axillae bilaterally.  Gait normal with good balance and coordination.  MSK:  Non tender with full range of motion and good stability and  symmetric strength and tone of shoulders, elbows, wrist, hip, knee and ankles bilaterally.  Back exam shows mildly increasing tenderness extension.  Tender to palpation paraspinal musculature lumbar spine regarding left.  Positive Corky Sox.  Negative straight leg test.   Impression and Recommendations:     This case required medical decision making of moderate complexity. The above documentation has been reviewed and is accurate and complete Lyndal Pulley, DO       Note: This dictation was prepared with Dragon dictation along with smaller phrase technology. Any transcriptional errors that result from this process are unintentional.

## 2019-01-03 ENCOUNTER — Encounter: Payer: Self-pay | Admitting: Family Medicine

## 2019-01-03 ENCOUNTER — Ambulatory Visit (INDEPENDENT_AMBULATORY_CARE_PROVIDER_SITE_OTHER): Payer: PRIVATE HEALTH INSURANCE | Admitting: Family Medicine

## 2019-01-03 ENCOUNTER — Other Ambulatory Visit: Payer: Self-pay

## 2019-01-03 VITALS — BP 104/70 | HR 79 | Ht 65.0 in | Wt 167.0 lb

## 2019-01-03 DIAGNOSIS — S338XXD Sprain of other parts of lumbar spine and pelvis, subsequent encounter: Secondary | ICD-10-CM | POA: Diagnosis not present

## 2019-01-03 DIAGNOSIS — M549 Dorsalgia, unspecified: Secondary | ICD-10-CM

## 2019-01-03 NOTE — Assessment & Plan Note (Signed)
Iliolumbar ligament sprain.  Discussed which activities to do which wants to avoid.  Patient has been making some progress.  Has not responded well to osteopathic manipulation previously.  Patient will be moving out of the country.  We discussed if need any type of refill she will look at her bottles at home otherwise patient will follow-up when she returns

## 2020-10-22 IMAGING — CR DG HIP (WITH OR WITHOUT PELVIS) 1V*L*
1 series · 1 of 1 positions shown · non-contrast
Comparison: None.

CLINICAL DATA: Left hip and buttocks pain. Twenty-four week
pregnant patient.

EXAM:
DG HIP (WITH OR WITHOUT PELVIS) 1V*L*

[hip ap]
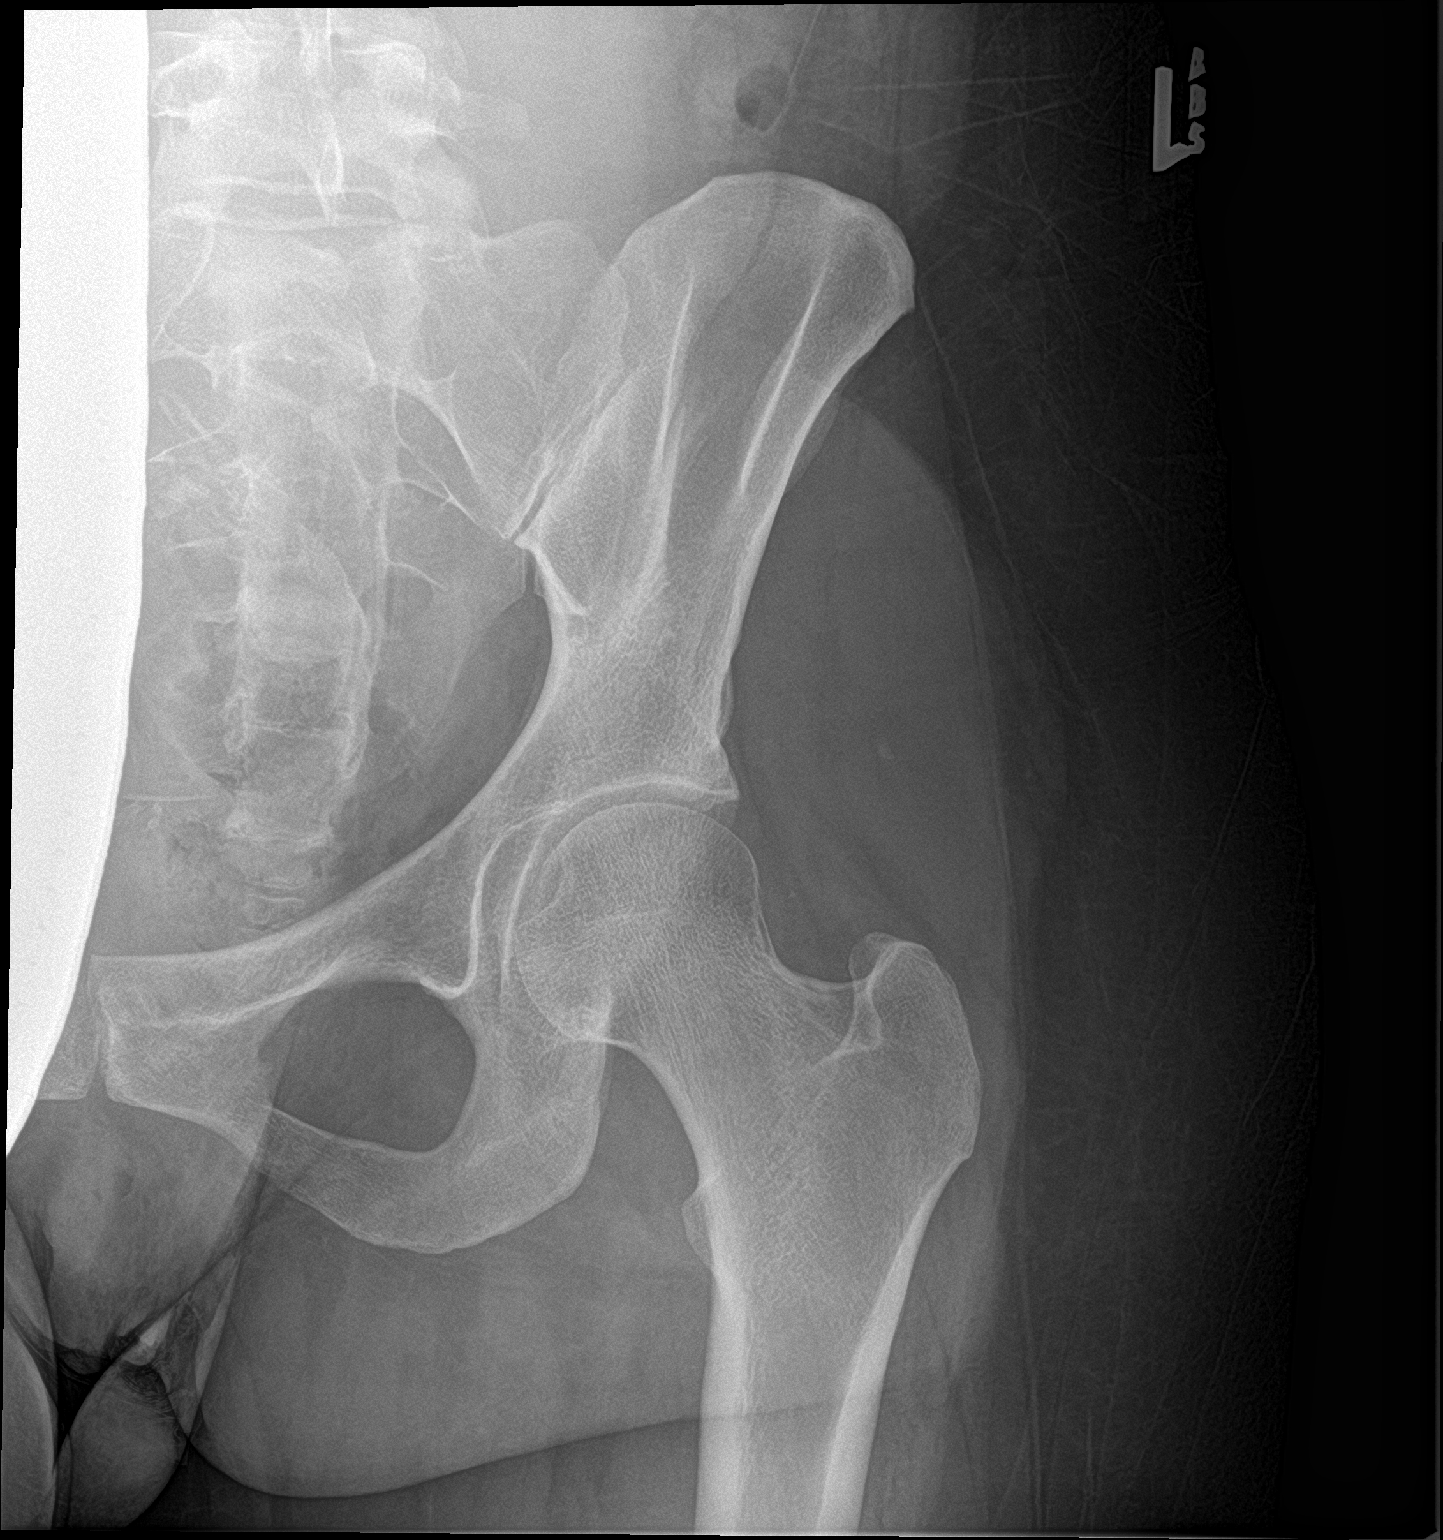

[1 of 1 positions shown; findings below may reference images not displayed]

FINDINGS: Only a single AP view was obtained period on this view, no evidence
of fracture or dislocation.
IMPRESSION: Only a single AP view was obtained of the left hip. On this view, no
fracture or dislocation noted.
